# Patient Record
Sex: Female | Born: 1955 | Race: Black or African American | Hispanic: No | Marital: Single | Smoking: Current every day smoker
Health system: Southern US, Community
[De-identification: ages and names within clinical notes are randomized; demographics above are authoritative.]

---

## 2005-10-06 ENCOUNTER — Emergency Department (HOSPITAL_COMMUNITY): Admission: EM | Admit: 2005-10-06 | Discharge: 2005-10-06 | Payer: Self-pay | Admitting: *Deleted

## 2007-11-14 ENCOUNTER — Emergency Department (HOSPITAL_COMMUNITY): Admission: EM | Admit: 2007-11-14 | Discharge: 2007-11-14 | Payer: Self-pay | Admitting: Emergency Medicine

## 2007-11-17 ENCOUNTER — Emergency Department (HOSPITAL_COMMUNITY): Admission: EM | Admit: 2007-11-17 | Discharge: 2007-11-17 | Payer: Self-pay | Admitting: Emergency Medicine

## 2010-10-14 LAB — POCT I-STAT, CHEM 8
BUN: 15
Chloride: 106
Creatinine, Ser: 1.2
Potassium: 4.4
Sodium: 140

## 2018-10-31 ENCOUNTER — Other Ambulatory Visit: Payer: Self-pay

## 2018-10-31 ENCOUNTER — Encounter (HOSPITAL_COMMUNITY): Payer: Self-pay

## 2018-10-31 ENCOUNTER — Ambulatory Visit (HOSPITAL_COMMUNITY)
Admission: EM | Admit: 2018-10-31 | Discharge: 2018-10-31 | Disposition: A | Discharge status: Home / Self Care | Payer: Self-pay | Attending: Family Medicine | Admitting: Family Medicine

## 2018-10-31 DIAGNOSIS — M5431 Sciatica, right side: Secondary | ICD-10-CM

## 2018-10-31 MED ORDER — METHYLPREDNISOLONE 4 MG PO TBPK: ORAL_TABLET | ORAL | 0 refills | Status: DC

## 2018-10-31 MED ORDER — HYDROCODONE-ACETAMINOPHEN 5-325 MG PO TABS: 1.0000 | ORAL_TABLET | Freq: Four times a day (QID) | ORAL | 0 refills | Status: DC | PRN

## 2018-10-31 MED ORDER — NAPROXEN 500 MG PO TABS: 500.0000 mg | ORAL_TABLET | Freq: Two times a day (BID) | ORAL | 0 refills | Status: DC

## 2018-10-31 MED ORDER — TIZANIDINE HCL 4 MG PO TABS: 4.0000 mg | ORAL_TABLET | Freq: Four times a day (QID) | ORAL | 0 refills | Status: DC | PRN

## 2018-10-31 NOTE — ED Provider Notes (Signed)
[place]    CSN: [id number] Arrival date & time: 10/31/18  [id number]      History   Chief Complaint Chief Complaint  Patient presents with  . Back Pain    HPI Tracy Escobar is a 63 y.o. female.   HPI  Patient states she is healthy.  She is on no medications.  She is retired.  She lives with a nephew.  She is here for low back pain.'s been going on for about 2 months.  At first it was intermittent and mild.  Now is becoming more severe.  The pain is in the right low back.  It goes from her buttock all the way down to the leg.  Is worse with activity.  Is better with rest.  The worst activity is prolonged standing.  No falls or injury.  No trauma.  No motor vehicle accident.  No prior back problems. Explained to patient that her blood pressure is elevated.  She states it is often elevated when she goes to the doctor.  She is in pain.  She is not sleeping well. No bowel or bladder complaint.  History reviewed. No pertinent past medical history.  There are no active problems to display for this patient.   History reviewed. No pertinent surgical history.  OB History   No obstetric history on file.      Home Medications    Prior to Admission medications   Medication Sig Start Date End Date Taking? Authorizing Provider  HYDROcodone-acetaminophen (NORCO/VICODIN) 5-325 MG tablet Take 1-2 tablets by mouth every 6 (six) hours as needed. 10/31/18   Raylene Everts, MD  methylPREDNISolone (MEDROL DOSEPAK) 4 MG TBPK tablet TAD 10/31/18   Raylene Everts, MD  naproxen (NAPROSYN) 500 MG tablet Take 1 tablet (500 mg total) by mouth 2 (two) times daily. 10/31/18   Raylene Everts, MD  tiZANidine (ZANAFLEX) 4 MG tablet Take 1-2 tablets (4-8 mg total) by mouth every 6 (six) hours as needed for muscle spasms. 10/31/18   Raylene Everts, MD    Family History Family History  Problem Relation Age of Onset  . Cancer Mother   . Cancer Father     Social History  Social History   Tobacco Use  . Smoking status: Current Every Day Smoker    Packs/day: 0.50    Types: Cigarettes  . Smokeless tobacco: Never Used  Substance Use Topics  . Alcohol use: Yes    Comment: socially  . Drug use: Not on file     Allergies   Patient has no known allergies.   Review of Systems Review of Systems  Constitutional: Negative for chills and fever.  HENT: Negative for ear pain and sore throat.   Eyes: Negative for pain and visual disturbance.  Respiratory: Negative for cough and shortness of breath.   Cardiovascular: Negative for chest pain and palpitations.  Gastrointestinal: Negative for abdominal pain and vomiting.  Genitourinary: Negative for dysuria and hematuria.  Musculoskeletal: Positive for back pain. Negative for arthralgias.  Skin: Negative for color change and rash.  Neurological: Negative for seizures and syncope.  All other systems reviewed and are negative.    Physical Exam Triage Vital Signs ED Triage Vitals  Enc Vitals Group     BP 10/31/18 1047 (!) 182/89     Pulse Rate 10/31/18 1047 76     Resp 10/31/18 1047 16     Temp 10/31/18 1047 98.3 F (36.8 C)     Temp Source  10/31/18 1047 Oral     SpO2 10/31/18 1047 100 %     Weight --      Height --      Head Circumference --      Peak Flow --      Pain Score 10/31/18 1044 7     Pain Loc --      Pain Edu? --      Excl. in [place]? --    No data found.  Updated Vital Signs BP (!) 182/89 (BP Location: Right Arm)   Pulse 76   Temp 98.3 F (36.8 C) (Oral)   Resp 16   LMP 10/30/2000 (Approximate)   SpO2 100%  :     Physical Exam Constitutional:      General: She is not in acute distress.    Appearance: She is well-developed.     Comments: Moves slowly.  Guarded movements.  Appears uncomfortable  HENT:     Head: Normocephalic and atraumatic.     Mouth/Throat:     Mouth: Mucous membranes are moist.     Pharynx: No posterior oropharyngeal erythema.  Eyes:      Conjunctiva/sclera: Conjunctivae normal.     Pupils: Pupils are equal, round, and reactive to light.  Neck:     Musculoskeletal: Normal range of motion and neck supple. No muscular tenderness.  Cardiovascular:     Rate and Rhythm: Normal rate and regular rhythm.     Heart sounds: Normal heart sounds.  Pulmonary:     Effort: Pulmonary effort is normal. No respiratory distress.     Breath sounds: Normal breath sounds.  Abdominal:     General: There is no distension.     Palpations: Abdomen is soft.  Musculoskeletal: Normal range of motion.     Comments: Lumbar spine is straight and symmetric. Full range of motion. No tenderness or muscle spasm. Strength, sensation, range of motion, are normal in both lower extremities. Straight leg raise is negative bilateral.At  Full right leg extension patient complains of buttock pain   Skin:    General: Skin is warm and dry.  Neurological:     General: No focal deficit present.     Mental Status: She is alert.     Deep Tendon Reflexes: Reflexes abnormal.     Comments: Right knee reflex is diminished  Psychiatric:        Mood and Affect: Mood normal.      UC Treatments / Results  Labs (all labs ordered are listed, but only abnormal results are displayed) Labs Reviewed - No data to display  EKG   Radiology No results found.  Procedures Procedures (including critical care time)  Medications Ordered in UC Medications - No data to display  Initial Impression / Assessment and Plan / UC Course  I have reviewed the triage vital signs and the nursing notes.  Pertinent labs & imaging results that were available during my care of the patient were reviewed by me and considered in my medical decision making (see chart for details).     Reviewed x-rays are not indicated for nontraumatic pain.  This has been going on for a couple of months without improvement in spite of modifying activities and taking a variety of medicines including Advil  and Aleve.  We will treat medically.  Patient understands that if she fails to improve she may need additional care Final Clinical Impressions(s) / UC Diagnoses   Final diagnoses:  Sciatica of right side  Discharge Instructions     Take the medrol dosepack This is the steroid to reduce inflammation around the nerve Take the naprosyn AFTER dosepack  Take the tizanadine ( muscle relaxer) and the hydrocodone ( pain as needed) Activity as tolerated, rest frequently during day Return or see PCP if not improving in a week or two    ED Prescriptions    Medication Sig Dispense Auth. Provider   methylPREDNISolone (MEDROL DOSEPAK) 4 MG TBPK tablet TAD 21 tablet Raylene Everts, MD   naproxen (NAPROSYN) 500 MG tablet Take 1 tablet (500 mg total) by mouth 2 (two) times daily. 30 tablet Raylene Everts, MD   tiZANidine (ZANAFLEX) 4 MG tablet Take 1-2 tablets (4-8 mg total) by mouth every 6 (six) hours as needed for muscle spasms. 21 tablet Raylene Everts, MD   HYDROcodone-acetaminophen (NORCO/VICODIN) 5-325 MG tablet Take 1-2 tablets by mouth every 6 (six) hours as needed. 10 tablet Raylene Everts, MD     I have reviewed the PDMP during this encounter.   Raylene Everts, MD 10/31/18 1122

## 2018-10-31 NOTE — Discharge Instructions (Signed)
Take the medrol dosepack This is the steroid to reduce inflammation around the nerve Take the naprosyn AFTER dosepack  Take the tizanadine ( muscle relaxer) and the hydrocodone ( pain as needed) Activity as tolerated, rest frequently during day Return or see PCP if not improving in a week or two

## 2018-10-31 NOTE — ED Triage Notes (Signed)
Patient presents to Urgent Care with complaints of back pain (all along spine) since 2 months ago. Patient reports sometimes the pain radiates from her back all the way down to her feet.

## 2019-01-15 ENCOUNTER — Other Ambulatory Visit: Payer: Self-pay

## 2019-01-15 ENCOUNTER — Emergency Department (HOSPITAL_COMMUNITY)
Admission: EM | Admit: 2019-01-15 | Discharge: 2019-01-15 | Disposition: A | Discharge status: Home / Self Care | Payer: Self-pay | Attending: Emergency Medicine | Admitting: Emergency Medicine

## 2019-01-15 DIAGNOSIS — Z79899 Other long term (current) drug therapy: Secondary | ICD-10-CM | POA: Insufficient documentation

## 2019-01-15 DIAGNOSIS — F17210 Nicotine dependence, cigarettes, uncomplicated: Secondary | ICD-10-CM | POA: Insufficient documentation

## 2019-01-15 DIAGNOSIS — M5441 Lumbago with sciatica, right side: Secondary | ICD-10-CM | POA: Insufficient documentation

## 2019-01-15 MED ORDER — KETOROLAC TROMETHAMINE 30 MG/ML IJ SOLN: 30.0000 mg | Freq: Once | INTRAMUSCULAR | Status: DC

## 2019-01-15 MED ORDER — KETOROLAC TROMETHAMINE 30 MG/ML IJ SOLN
30.0000 mg | Freq: Once | INTRAMUSCULAR | Status: AC
  Administered 2019-01-15: 30 mg via INTRAMUSCULAR
  Filled 2019-01-15: qty 1

## 2019-01-15 MED ORDER — PREDNISONE 20 MG PO TABS: 40.0000 mg | ORAL_TABLET | Freq: Every day | ORAL | 0 refills | Status: AC

## 2019-01-15 NOTE — Discharge Instructions (Addendum)
You have been seen today for back pain. Please read and follow all provided instructions. Return to the emergency room for worsening condition or new concerning symptoms.  1. Medications:  Prescription to your pharmacy for prednisone.  This is a steroid used to help treat inflammation.  Please take as prescribed.  Continue usual home medications Take medications as prescribed. Please review all of the medicines and only take them if you do not have an allergy to them.   2. Treatment: rest, drink plenty of fluids  3. Follow Up:  Please follow up with primary care provider by scheduling an appointment as soon as possible for a visit  If you do not have a primary care physician, contact [facility] at [phone] for referral   It is also a possibility that you have an allergic reaction to any of the medicines that you have been prescribed - Everybody reacts differently to medications and while MOST people have no trouble with most medicines, you may have a reaction such as nausea, vomiting, rash, swelling, shortness of breath. If this is the case, please stop taking the medicine immediately and contact your physician.   [facility] Dermatologists:  [facility]  [place] [address]  [phone]   Dr. Michelene Gardener, MD  [place]  [phone]  [facility]  [place]  [phone]   [place]  [phone]  Lavonna Monarch MD  [place]  [phone]  Katrina Stack  [place]  [phone]  [place] Amy Y MD  [address]  [phone]  [place]  [address]  [phone]

## 2019-01-15 NOTE — ED Provider Notes (Signed)
[place] EMERGENCY DEPARTMENT Provider Note   CSN: [id number] Arrival date & time: 01/15/19  1140     History Chief Complaint  Patient presents with  . Leg Pain  . Back Pain    Tracy Escobar is a 64 y.o. female presenting to emergency department today with chief complaint of back pain x 4 days. Patient states pain is in her right lower back and radiates down her leg. She describes pain as sharp and shooting. Pain is worse with movement and better at rest. She had tried taking tylenol without symptom relief. She has history of similar pain in 10/2018 treated with steroids with complete resolution of symptoms, thought to be sciatica.   Denies fevers, weight loss, numbness/weakness of upper and lower extremities, bowel/bladder incontinence, urinary retention, history of cancer, saddle anesthesia, history of back surgery, history of IVDA. History provided by patient with additional history obtained from chart review.        No past medical history on file.  There are no problems to display for this patient.   No past surgical history on file.   OB History   No obstetric history on file.     Family History  Problem Relation Age of Onset  . Cancer Mother   . Cancer Father     Social History   Tobacco Use  . Smoking status: Current Every Day Smoker    Packs/day: 0.50    Types: Cigarettes  . Smokeless tobacco: Never Used  Substance Use Topics  . Alcohol use: Yes    Comment: socially  . Drug use: Not on file    Home Medications Prior to Admission medications   Medication Sig Start Date End Date Taking? Authorizing Provider  HYDROcodone-acetaminophen (NORCO/VICODIN) 5-325 MG tablet Take 1-2 tablets by mouth every 6 (six) hours as needed. 10/31/18   Raylene Everts, MD  naproxen (NAPROSYN) 500 MG tablet Take 1 tablet (500 mg total) by mouth 2 (two) times daily. 10/31/18   Raylene Everts, MD  predniSONE (DELTASONE) 20 MG tablet Take 2 tablets  (40 mg total) by mouth daily for 5 days. 01/15/19 01/20/19  [employer] Null E, PA-C  tiZANidine (ZANAFLEX) 4 MG tablet Take 1-2 tablets (4-8 mg total) by mouth every 6 (six) hours as needed for muscle spasms. 10/31/18   Raylene Everts, MD    Allergies    Patient has no known allergies.  Review of Systems   Review of Systems  All other systems are reviewed and are negative for acute change except as noted in the HPI.   Physical Exam Updated Vital Signs BP 124/80   Pulse 86   Temp 97.7 F (36.5 C) (Oral)   Resp 16   LMP 10/30/2000 (Approximate)   SpO2 95%   Physical Exam Vitals and nursing note reviewed.  Constitutional:      General: She is not in acute distress.    Appearance: She is not ill-appearing.  HENT:     Head: Normocephalic and atraumatic.     Right Ear: External ear normal.     Left Ear: External ear normal.     Nose: Nose normal.     Mouth/Throat:     Mouth: Mucous membranes are moist.     Pharynx: Oropharynx is clear.  Eyes:     General: No scleral icterus.       Right eye: No discharge.        Left eye: No discharge.     Conjunctiva/sclera: Conjunctivae  normal.  Neck:     Vascular: No JVD.  Cardiovascular:     Rate and Rhythm: Normal rate and regular rhythm.     Pulses: Normal pulses.          Radial pulses are 2+ on the right side and 2+ on the left side.     Heart sounds: Normal heart sounds.  Pulmonary:     Comments: Lungs clear to auscultation in all fields. Symmetric chest rise. No wheezing, rales, or rhonchi. Abdominal:     Comments: Abdomen is soft, non-distended, and non-tender in all quadrants. No rigidity, no guarding. No peritoneal signs.  Musculoskeletal:        General: Normal range of motion.     Cervical back: Normal range of motion.     Comments: Full range of motion of thoracic and lumbar spine. Tenderness to palpation of paraspinal muscles of lumbar spine and right buttock. No  muscle spasm. Strength, sensation, range of motion,  are normal in both lower extremities. No overlying skin changes. Straight leg raise is negative bilaterally.   Skin:    General: Skin is warm and dry.     Capillary Refill: Capillary refill takes less than 2 seconds.  Neurological:     Mental Status: She is oriented to person, place, and time.     GCS: GCS eye subscore is 4. GCS verbal subscore is 5. GCS motor subscore is 6.     Comments: Sensation grossly intact to light touch in the lower extremities bilaterally. No saddle anesthesias. Strength 5/5 with flexion and extension at the bilateral hips, knees, and ankles. No noted gait deficit. Coordination intact with heel to shin testing.  Psychiatric:        Behavior: Behavior normal.       ED Results / Procedures / Treatments   Labs (all labs ordered are listed, but only abnormal results are displayed) Labs Reviewed - No data to display  EKG None  Radiology No results found.  Procedures Procedures (including critical care time)  Medications Ordered in ED Medications  ketorolac (TORADOL) 30 MG/ML injection 30 mg (30 mg Intramuscular Given 01/15/19 1522)    ED Course  I have reviewed the triage vital signs and the nursing notes.  Pertinent labs & imaging results that were available during my care of the patient were reviewed by me and considered in my medical decision making (see chart for details).    MDM Rules/Calculators/A&P                      Normal neurological exam, no evidence of urinary incontinence or retention, pain is consistently reproducible. There is no evidence of AAA or concern for dissection at this time.   Patient can walk but states is painful.  No loss of bowel or bladder control.  No concern for cauda equina.  No fever, night sweats, weight loss, h/o cancer, IVDU.  Pain treated here in the department with adequate improvement. Will discharge with short course of steroids and recommend tylenol for pain. I have also discussed reasons to return immediately  to the ER.  Patient expresses understanding and agrees with plan. Recommend she follow up with pcp if symptoms persist.  The patient appears reasonably screened and/or stabilized for discharge and I doubt any other medical condition or other [facility] requiring further screening, evaluation, or treatment in the ED at this time prior to discharge.   Portions of this note were generated with L[employer]. Dictation errors  may occur despite best attempts at proofreading.    Final Clinical Impression(s) / ED Diagnoses Final diagnoses:  Acute low back pain with right-sided sciatica, unspecified back pain laterality    Rx / DC Orders ED Discharge Orders         Ordered    predniSONE (DELTASONE) 20 MG tablet  Daily     01/15/19 1503           Flint Melter 01/15/19 2140    Virgel Manifold, MD 01/16/19 [phone]

## 2019-01-15 NOTE — ED Triage Notes (Signed)
Patient complains of back pain with radiation to leg for several days, hx of sciatica. Denies trauma

## 2019-01-20 ENCOUNTER — Other Ambulatory Visit: Payer: Self-pay

## 2019-01-20 ENCOUNTER — Inpatient Hospital Stay (HOSPITAL_COMMUNITY)
Admission: EM | Admit: 2019-01-20 | Discharge: 2019-02-05 | DRG: 177 | Disposition: A | Discharge status: Home Health | Payer: HRSA Program | Attending: Internal Medicine | Admitting: Internal Medicine

## 2019-01-20 ENCOUNTER — Emergency Department (HOSPITAL_COMMUNITY): Payer: HRSA Program

## 2019-01-20 DIAGNOSIS — N19 Unspecified kidney failure: Secondary | ICD-10-CM | POA: Diagnosis not present

## 2019-01-20 DIAGNOSIS — E1165 Type 2 diabetes mellitus with hyperglycemia: Secondary | ICD-10-CM | POA: Diagnosis present

## 2019-01-20 DIAGNOSIS — Z7289 Other problems related to lifestyle: Secondary | ICD-10-CM

## 2019-01-20 DIAGNOSIS — G9341 Metabolic encephalopathy: Secondary | ICD-10-CM | POA: Diagnosis present

## 2019-01-20 DIAGNOSIS — N184 Chronic kidney disease, stage 4 (severe): Secondary | ICD-10-CM | POA: Diagnosis present

## 2019-01-20 DIAGNOSIS — E1122 Type 2 diabetes mellitus with diabetic chronic kidney disease: Secondary | ICD-10-CM | POA: Diagnosis present

## 2019-01-20 DIAGNOSIS — F17210 Nicotine dependence, cigarettes, uncomplicated: Secondary | ICD-10-CM | POA: Diagnosis present

## 2019-01-20 DIAGNOSIS — R252 Cramp and spasm: Secondary | ICD-10-CM | POA: Diagnosis not present

## 2019-01-20 DIAGNOSIS — J1282 Pneumonia due to coronavirus disease 2019: Secondary | ICD-10-CM | POA: Diagnosis present

## 2019-01-20 DIAGNOSIS — N179 Acute kidney failure, unspecified: Secondary | ICD-10-CM

## 2019-01-20 DIAGNOSIS — E669 Obesity, unspecified: Secondary | ICD-10-CM | POA: Diagnosis present

## 2019-01-20 DIAGNOSIS — R4182 Altered mental status, unspecified: Secondary | ICD-10-CM

## 2019-01-20 DIAGNOSIS — N390 Urinary tract infection, site not specified: Secondary | ICD-10-CM | POA: Diagnosis present

## 2019-01-20 DIAGNOSIS — R451 Restlessness and agitation: Secondary | ICD-10-CM | POA: Diagnosis not present

## 2019-01-20 DIAGNOSIS — Z713 Dietary counseling and surveillance: Secondary | ICD-10-CM

## 2019-01-20 DIAGNOSIS — Z6830 Body mass index (BMI) 30.0-30.9, adult: Secondary | ICD-10-CM

## 2019-01-20 DIAGNOSIS — E875 Hyperkalemia: Secondary | ICD-10-CM | POA: Diagnosis present

## 2019-01-20 DIAGNOSIS — U071 COVID-19: Principal | ICD-10-CM | POA: Diagnosis present

## 2019-01-20 DIAGNOSIS — E876 Hypokalemia: Secondary | ICD-10-CM | POA: Diagnosis not present

## 2019-01-20 DIAGNOSIS — Z23 Encounter for immunization: Secondary | ICD-10-CM

## 2019-01-20 DIAGNOSIS — D638 Anemia in other chronic diseases classified elsewhere: Secondary | ICD-10-CM | POA: Diagnosis present

## 2019-01-20 DIAGNOSIS — R0602 Shortness of breath: Secondary | ICD-10-CM

## 2019-01-20 DIAGNOSIS — Z6831 Body mass index (BMI) 31.0-31.9, adult: Secondary | ICD-10-CM

## 2019-01-20 DIAGNOSIS — Z79899 Other long term (current) drug therapy: Secondary | ICD-10-CM

## 2019-01-20 DIAGNOSIS — F419 Anxiety disorder, unspecified: Secondary | ICD-10-CM | POA: Diagnosis present

## 2019-01-20 DIAGNOSIS — I129 Hypertensive chronic kidney disease with stage 1 through stage 4 chronic kidney disease, or unspecified chronic kidney disease: Secondary | ICD-10-CM | POA: Diagnosis present

## 2019-01-20 DIAGNOSIS — D72829 Elevated white blood cell count, unspecified: Secondary | ICD-10-CM | POA: Diagnosis present

## 2019-01-20 DIAGNOSIS — E872 Acidosis: Secondary | ICD-10-CM | POA: Diagnosis present

## 2019-01-20 LAB — BASIC METABOLIC PANEL
Anion gap: 15 (ref 5–15)
BUN: 148 mg/dL — ABNORMAL HIGH (ref 8–23)
CO2: 10 mmol/L — ABNORMAL LOW (ref 22–32)
Calcium: 8.8 mg/dL — ABNORMAL LOW (ref 8.9–10.3)
Chloride: 112 mmol/L — ABNORMAL HIGH (ref 98–111)
Creatinine, Ser: 8.64 mg/dL — ABNORMAL HIGH (ref 0.44–1.00)
GFR calc Af Amer: 5 mL/min — ABNORMAL LOW (ref >60)
GFR calc non Af Amer: 4 mL/min — ABNORMAL LOW (ref >60)
Glucose, Bld: 145 mg/dL — ABNORMAL HIGH (ref 70–99)
Potassium: 6 mmol/L — ABNORMAL HIGH (ref 3.5–5.1)
Sodium: 137 mmol/L (ref 135–145)

## 2019-01-20 LAB — CBC
HCT: 32 % — ABNORMAL LOW (ref 36.0–46.0)
Hemoglobin: 10.7 g/dL — ABNORMAL LOW (ref 12.0–15.0)
MCH: 28.4 pg (ref 26.0–34.0)
MCHC: 33.4 g/dL (ref 30.0–36.0)
MCV: 84.9 fL (ref 80.0–100.0)
Platelets: 367 10*3/uL (ref 150–400)
RBC: 3.77 MIL/uL — ABNORMAL LOW (ref 3.87–5.11)
RDW: 16.7 % — ABNORMAL HIGH (ref 11.5–15.5)
WBC: 15.8 10*3/uL — ABNORMAL HIGH (ref 4.0–10.5)
nRBC: 0 % (ref 0.0–0.2)

## 2019-01-20 LAB — ACETAMINOPHEN LEVEL: Acetaminophen (Tylenol), Serum: <10 ug/mL — ABNORMAL LOW (ref 10–30)

## 2019-01-20 LAB — SALICYLATE LEVEL: Salicylate Lvl: <7 mg/dL — ABNORMAL LOW (ref 7.0–30.0)

## 2019-01-20 LAB — ETHANOL: Alcohol, Ethyl (B): <10 mg/dL (ref <10)

## 2019-01-20 LAB — CBG MONITORING, ED: Glucose-Capillary: 117 mg/dL — ABNORMAL HIGH (ref 70–99)

## 2019-01-20 MED ORDER — SODIUM CHLORIDE 0.9% FLUSH
3.0000 mL | Freq: Once | INTRAVENOUS | Status: AC
  Administered 2019-01-21: 01:00:00 3 mL via INTRAVENOUS

## 2019-01-20 NOTE — ED Provider Notes (Addendum)
[facility] Emergency Department Provider Note MRN:  [id number]  Arrival date & time: 01/21/19     Chief Complaint   Altered Mental Status   History of Present Illness   Tracy Escobar is a 64 y.o. year-old female with unknown past medical history presenting to the ED with chief complaint of altered mental status.  Patient not acting normally, brought here by family given concern that she may have taken too much gabapentin.  Patient is very confused.  I was unable to obtain an accurate HPI, PMH, or ROS due to the patient's altered mental status.  Level 5 caveat.  Review of Systems  Positive for altered mental status.  Patient's Health History   No past medical history on file.  No past surgical history on file.  Family History  Problem Relation Age of Onset  . Cancer Mother   . Cancer Father     Social History   Socioeconomic History  . Marital status: Single    Spouse name: Not on file  . Number of children: Not on file  . Years of education: Not on file  . Highest education level: Not on file  Occupational History  . Not on file  Tobacco Use  . Smoking status: Current Every Day Smoker    Packs/day: 0.50    Types: Cigarettes  . Smokeless tobacco: Never Used  Substance and Sexual Activity  . Alcohol use: Yes    Comment: socially  . Drug use: Not on file  . Sexual activity: Not on file  Other Topics Concern  . Not on file  Social History Narrative  . Not on file   Social Determinants of Health   Financial Resource Strain:   . Difficulty of Paying Living Expenses: Not on file  Food Insecurity:   . Worried About Charity fundraiser in the Last Year: Not on file  . Ran Out of Food in the Last Year: Not on file  Transportation Needs:   . Lack of Transportation (Medical): Not on file  . Lack of Transportation (Non-Medical): Not on file  Physical Activity:   . Days of Exercise per Week: Not on file  . Minutes of Exercise per Session:  Not on file  Stress:   . Feeling of Stress : Not on file  Social Connections:   . Frequency of Communication with Friends and Family: Not on file  . Frequency of Social Gatherings with Friends and Family: Not on file  . Attends Religious Services: Not on file  . Active Member of Clubs or Organizations: Not on file  . Attends Archivist Meetings: Not on file  . Marital Status: Not on file  Intimate Partner Violence:   . Fear of Current or Ex-Partner: Not on file  . Emotionally Abused: Not on file  . Physically Abused: Not on file  . Sexually Abused: Not on file     Physical Exam  Vital Signs and Nursing Notes reviewed Vitals:   01/20/19 2220  BP: (!) 128/110  Pulse: (!) 103  Resp: 18  Temp: 98 F (36.7 C)  SpO2: 92%    CONSTITUTIONAL: Well-appearing, NAD NEURO: Somnolent, opens eyes to voice, follows commands but is easily confused, normal and symmetric strength and sensation, clumsy gait EYES:  eyes equal and reactive ENT/NECK:  no LAD, no JVD CARDIO: Regular rate, well-perfused, normal S1 and S2 PULM:  CTAB no wheezing or rhonchi GI/GU:  normal bowel sounds, non-distended, non-tender MSK/SPINE:  No gross deformities,  no edema SKIN:  no rash, atraumatic PSYCH:  Appropriate speech and behavior  Diagnostic and Interventional Summary    EKG Interpretation  Date/Time:  Friday January 20 2019 23:41:57 EST Ventricular Rate:  95 PR Interval:    QRS Duration: 87 QT Interval:  326 QTC Calculation: 410 R Axis:   14 Text Interpretation: Sinus rhythm Consider right atrial enlargement Left ventricular hypertrophy No previous ECGs available Confirmed by Gerlene Fee [id number]) on 01/20/2019 11:44:56 PM      Labs Reviewed  BASIC METABOLIC PANEL - Abnormal; Notable for the following components:      Result Value   Potassium 6.0 (*)    Chloride 112 (*)    CO2 10 (*)    Glucose, Bld 145 (*)    BUN 148 (*)    Creatinine, Ser 8.64 (*)    Calcium 8.8 (*)    GFR calc  non Af Amer 4 (*)    GFR calc Af Amer 5 (*)    All other components within normal limits  CBC - Abnormal; Notable for the following components:   WBC 15.8 (*)    RBC 3.77 (*)    Hemoglobin 10.7 (*)    HCT 32.0 (*)    RDW 16.7 (*)    All other components within normal limits  SALICYLATE LEVEL - Abnormal; Notable for the following components:   Salicylate Lvl [id number] (*)    All other components within normal limits  ACETAMINOPHEN LEVEL - Abnormal; Notable for the following components:   Acetaminophen (Tylenol), Serum <10 (*)    All other components within normal limits  CBG MONITORING, ED - Abnormal; Notable for the following components:   Glucose-Capillary 117 (*)    All other components within normal limits  ETHANOL  URINALYSIS, ROUTINE W REFLEX MICROSCOPIC  HEPATIC FUNCTION PANEL    CT Head Wo Contrast  Final Result      Medications  sodium chloride flush (NS) 0.9 % injection 3 mL (has no administration in time range)     Procedures  /  Critical Care .Critical Care Performed by: Maudie Flakes, MD Authorized by: Maudie Flakes, MD   Critical care provider statement:    Critical care time (minutes):  33   Critical care was necessary to treat or prevent imminent or life-threatening deterioration of the following conditions:  Renal failure   Critical care was time spent personally by me on the following activities:  Discussions with consultants, evaluation of patient's response to treatment, examination of patient, ordering and performing treatments and interventions, ordering and review of laboratory studies, ordering and review of radiographic studies, pulse oximetry, re-evaluation of patient's condition, obtaining history from patient or surrogate and review of old charts    ED Course and Medical Decision Making  I have reviewed the triage vital signs, the nursing notes, and pertinent available records from the EMR.  Pertinent labs & imaging results that were available  during my care of the patient were reviewed by me and considered in my medical decision making (see below for details).     Favoring pharmacologic etiology of patient's altered mental status, also considering metabolic etiology, UTI, much less likely to be CNS related given patient's nonfocal exam.  Will attempt to contact family for further information, will begin with labs, urinalysis, CT head.  CT head is without acute process.  Awaiting laboratory assessment.  Anticipating need for metabolization and reassessment.  I spoke with patient's daughter, who knows very little information, advised me to  call patient's nephew.  I attempted to call but there was no answer and no voicemail.  Signed out to oncoming provider at shift change.  12 AM update: Labs revealing acute renal failure, will need nephrology consultation and admission.  Barth Kirks. Sedonia Small, MD [place] [email or web address]  Final Clinical Impressions(s) / ED Diagnoses     ICD-10-CM   1. Altered mental status, unspecified altered mental status type  R41.82   2. Acute renal failure, unspecified acute renal failure type [facility])  N17.9     ED Discharge Orders    None       Discharge Instructions Discussed with and Provided to Patient:   Discharge Instructions   None       Maudie Flakes, MD 01/20/19 2339    Maudie Flakes, MD 01/21/19 [phone]

## 2019-01-20 NOTE — ED Triage Notes (Signed)
Patient brought in by family with altered mental status, says she is not acting herself and concerned she may have taken too many gabapentin. The patient is restless, difficult to redirect at time, slurred speech. Says she took gabapentin, will not answer to how many.   Bary Richard (pt daughter [phone]) [address] [place] [id number]

## 2019-01-21 ENCOUNTER — Emergency Department (HOSPITAL_COMMUNITY): Payer: HRSA Program

## 2019-01-21 ENCOUNTER — Inpatient Hospital Stay (HOSPITAL_COMMUNITY): Payer: HRSA Program

## 2019-01-21 DIAGNOSIS — R4182 Altered mental status, unspecified: Secondary | ICD-10-CM | POA: Insufficient documentation

## 2019-01-21 DIAGNOSIS — J1282 Pneumonia due to coronavirus disease 2019: Secondary | ICD-10-CM | POA: Diagnosis present

## 2019-01-21 DIAGNOSIS — E875 Hyperkalemia: Secondary | ICD-10-CM | POA: Diagnosis present

## 2019-01-21 DIAGNOSIS — N390 Urinary tract infection, site not specified: Secondary | ICD-10-CM | POA: Diagnosis present

## 2019-01-21 DIAGNOSIS — D72829 Elevated white blood cell count, unspecified: Secondary | ICD-10-CM | POA: Diagnosis present

## 2019-01-21 DIAGNOSIS — E872 Acidosis: Secondary | ICD-10-CM | POA: Diagnosis present

## 2019-01-21 DIAGNOSIS — U071 COVID-19: Secondary | ICD-10-CM | POA: Diagnosis present

## 2019-01-21 DIAGNOSIS — N19 Unspecified kidney failure: Secondary | ICD-10-CM | POA: Diagnosis present

## 2019-01-21 DIAGNOSIS — D638 Anemia in other chronic diseases classified elsewhere: Secondary | ICD-10-CM | POA: Diagnosis present

## 2019-01-21 DIAGNOSIS — R252 Cramp and spasm: Secondary | ICD-10-CM | POA: Diagnosis not present

## 2019-01-21 DIAGNOSIS — Z23 Encounter for immunization: Secondary | ICD-10-CM | POA: Diagnosis not present

## 2019-01-21 DIAGNOSIS — F17210 Nicotine dependence, cigarettes, uncomplicated: Secondary | ICD-10-CM | POA: Diagnosis present

## 2019-01-21 DIAGNOSIS — Z79899 Other long term (current) drug therapy: Secondary | ICD-10-CM | POA: Diagnosis not present

## 2019-01-21 DIAGNOSIS — I129 Hypertensive chronic kidney disease with stage 1 through stage 4 chronic kidney disease, or unspecified chronic kidney disease: Secondary | ICD-10-CM | POA: Diagnosis present

## 2019-01-21 DIAGNOSIS — E876 Hypokalemia: Secondary | ICD-10-CM | POA: Diagnosis not present

## 2019-01-21 DIAGNOSIS — N179 Acute kidney failure, unspecified: Secondary | ICD-10-CM | POA: Diagnosis present

## 2019-01-21 DIAGNOSIS — R451 Restlessness and agitation: Secondary | ICD-10-CM | POA: Diagnosis not present

## 2019-01-21 DIAGNOSIS — E1122 Type 2 diabetes mellitus with diabetic chronic kidney disease: Secondary | ICD-10-CM | POA: Diagnosis present

## 2019-01-21 DIAGNOSIS — G9341 Metabolic encephalopathy: Secondary | ICD-10-CM | POA: Diagnosis present

## 2019-01-21 DIAGNOSIS — F419 Anxiety disorder, unspecified: Secondary | ICD-10-CM | POA: Diagnosis present

## 2019-01-21 DIAGNOSIS — E669 Obesity, unspecified: Secondary | ICD-10-CM | POA: Diagnosis present

## 2019-01-21 DIAGNOSIS — E1165 Type 2 diabetes mellitus with hyperglycemia: Secondary | ICD-10-CM | POA: Diagnosis present

## 2019-01-21 DIAGNOSIS — N184 Chronic kidney disease, stage 4 (severe): Secondary | ICD-10-CM | POA: Diagnosis present

## 2019-01-21 DIAGNOSIS — Z6831 Body mass index (BMI) 31.0-31.9, adult: Secondary | ICD-10-CM | POA: Diagnosis not present

## 2019-01-21 DIAGNOSIS — Z6830 Body mass index (BMI) 30.0-30.9, adult: Secondary | ICD-10-CM | POA: Diagnosis not present

## 2019-01-21 LAB — URINALYSIS, ROUTINE W REFLEX MICROSCOPIC
Bilirubin Urine: NEGATIVE
Glucose, UA: NEGATIVE mg/dL
Hgb urine dipstick: NEGATIVE
Ketones, ur: NEGATIVE mg/dL
Nitrite: NEGATIVE
Protein, ur: 100 mg/dL — AB
Specific Gravity, Urine: 1.034 — ABNORMAL HIGH (ref 1.005–1.030)
WBC, UA: >50 WBC/hpf — ABNORMAL HIGH (ref 0–5)
pH: 5 (ref 5.0–8.0)

## 2019-01-21 LAB — PROTEIN / CREATININE RATIO, URINE
Creatinine, Urine: 129.95 mg/dL
Total Protein, Urine: >3000 mg/dL

## 2019-01-21 LAB — CBC
HCT: 29.6 % — ABNORMAL LOW (ref 36.0–46.0)
HCT: 27.1 % — ABNORMAL LOW (ref 36.0–46.0)
Hemoglobin: 10 g/dL — ABNORMAL LOW (ref 12.0–15.0)
Hemoglobin: 9.2 g/dL — ABNORMAL LOW (ref 12.0–15.0)
MCH: 28.6 pg (ref 26.0–34.0)
MCH: 28.1 pg (ref 26.0–34.0)
MCHC: 33.8 g/dL (ref 30.0–36.0)
MCHC: 33.9 g/dL (ref 30.0–36.0)
MCV: 84.6 fL (ref 80.0–100.0)
MCV: 82.9 fL (ref 80.0–100.0)
Platelets: 600 10*3/uL — ABNORMAL HIGH (ref 150–400)
Platelets: 341 10*3/uL (ref 150–400)
RBC: 3.5 MIL/uL — ABNORMAL LOW (ref 3.87–5.11)
RBC: 3.27 MIL/uL — ABNORMAL LOW (ref 3.87–5.11)
RDW: 18 % — ABNORMAL HIGH (ref 11.5–15.5)
RDW: 16.4 % — ABNORMAL HIGH (ref 11.5–15.5)
WBC: 18 10*3/uL — ABNORMAL HIGH (ref 4.0–10.5)
WBC: 12.8 10*3/uL — ABNORMAL HIGH (ref 4.0–10.5)
nRBC: 0.1 % (ref 0.0–0.2)
nRBC: 0 % (ref 0.0–0.2)

## 2019-01-21 LAB — LACTIC ACID, PLASMA
Lactic Acid, Venous: 1.4 mmol/L (ref 0.5–1.9)
Lactic Acid, Venous: 1.1 mmol/L (ref 0.5–1.9)

## 2019-01-21 LAB — BASIC METABOLIC PANEL
Anion gap: 13 (ref 5–15)
Anion gap: 15 (ref 5–15)
Anion gap: 16 — ABNORMAL HIGH (ref 5–15)
BUN: 150 mg/dL — ABNORMAL HIGH (ref 8–23)
BUN: 147 mg/dL — ABNORMAL HIGH (ref 8–23)
BUN: 142 mg/dL — ABNORMAL HIGH (ref 8–23)
CO2: 13 mmol/L — ABNORMAL LOW (ref 22–32)
CO2: 14 mmol/L — ABNORMAL LOW (ref 22–32)
CO2: 17 mmol/L — ABNORMAL LOW (ref 22–32)
Calcium: 8.4 mg/dL — ABNORMAL LOW (ref 8.9–10.3)
Calcium: 8 mg/dL — ABNORMAL LOW (ref 8.9–10.3)
Calcium: 7.8 mg/dL — ABNORMAL LOW (ref 8.9–10.3)
Chloride: 114 mmol/L — ABNORMAL HIGH (ref 98–111)
Chloride: 113 mmol/L — ABNORMAL HIGH (ref 98–111)
Chloride: 109 mmol/L (ref 98–111)
Creatinine, Ser: 8.87 mg/dL — ABNORMAL HIGH (ref 0.44–1.00)
Creatinine, Ser: 8.86 mg/dL — ABNORMAL HIGH (ref 0.44–1.00)
Creatinine, Ser: 9.14 mg/dL — ABNORMAL HIGH (ref 0.44–1.00)
GFR calc Af Amer: 5 mL/min — ABNORMAL LOW (ref >60)
GFR calc Af Amer: 5 mL/min — ABNORMAL LOW (ref >60)
GFR calc Af Amer: 5 mL/min — ABNORMAL LOW (ref >60)
GFR calc non Af Amer: 4 mL/min — ABNORMAL LOW (ref >60)
GFR calc non Af Amer: 4 mL/min — ABNORMAL LOW (ref >60)
GFR calc non Af Amer: 4 mL/min — ABNORMAL LOW (ref >60)
Glucose, Bld: 142 mg/dL — ABNORMAL HIGH (ref 70–99)
Glucose, Bld: 126 mg/dL — ABNORMAL HIGH (ref 70–99)
Glucose, Bld: 189 mg/dL — ABNORMAL HIGH (ref 70–99)
Potassium: 6 mmol/L — ABNORMAL HIGH (ref 3.5–5.1)
Potassium: 4.9 mmol/L (ref 3.5–5.1)
Potassium: 4.1 mmol/L (ref 3.5–5.1)
Sodium: 140 mmol/L (ref 135–145)
Sodium: 142 mmol/L (ref 135–145)
Sodium: 142 mmol/L (ref 135–145)

## 2019-01-21 LAB — POCT I-STAT 7, (LYTES, BLD GAS, ICA,H+H)
Acid-base deficit: 11 mmol/L — ABNORMAL HIGH (ref 0.0–2.0)
Bicarbonate: 11.6 mmol/L — ABNORMAL LOW (ref 20.0–28.0)
Calcium, Ion: 1.18 mmol/L (ref 1.15–1.40)
HCT: 29 % — ABNORMAL LOW (ref 36.0–46.0)
Hemoglobin: 9.9 g/dL — ABNORMAL LOW (ref 12.0–15.0)
O2 Saturation: 94 %
Patient temperature: 98.1
Potassium: 5.6 mmol/L — ABNORMAL HIGH (ref 3.5–5.1)
Sodium: 139 mmol/L (ref 135–145)
TCO2: 12 mmol/L — ABNORMAL LOW (ref 22–32)
pCO2 arterial: 18.2 mmHg — CL (ref 32.0–48.0)
pH, Arterial: 7.411 (ref 7.350–7.450)
pO2, Arterial: 66 mmHg — ABNORMAL LOW (ref 83.0–108.0)

## 2019-01-21 LAB — HEPATIC FUNCTION PANEL
ALT: 10 U/L (ref 0–44)
AST: 16 U/L (ref 15–41)
Albumin: 1.6 g/dL — ABNORMAL LOW (ref 3.5–5.0)
Alkaline Phosphatase: 42 U/L (ref 38–126)
Bilirubin, Direct: <0.1 mg/dL (ref 0.0–0.2)
Total Bilirubin: 0.5 mg/dL (ref 0.3–1.2)
Total Protein: 6.4 g/dL — ABNORMAL LOW (ref 6.5–8.1)

## 2019-01-21 LAB — D-DIMER, QUANTITATIVE: D-Dimer, Quant: 6.5 ug/mL-FEU — ABNORMAL HIGH (ref 0.00–0.50)

## 2019-01-21 LAB — RESPIRATORY PANEL BY RT PCR (FLU A&B, COVID)
Influenza A by PCR: NEGATIVE
Influenza B by PCR: NEGATIVE
SARS Coronavirus 2 by RT PCR: POSITIVE — AB

## 2019-01-21 LAB — CBG MONITORING, ED: Glucose-Capillary: 137 mg/dL — ABNORMAL HIGH (ref 70–99)

## 2019-01-21 LAB — HIV ANTIBODY (ROUTINE TESTING W REFLEX): HIV Screen 4th Generation wRfx: NONREACTIVE

## 2019-01-21 LAB — CK: Total CK: 65 U/L (ref 38–234)

## 2019-01-21 LAB — C-REACTIVE PROTEIN: CRP: 26.4 mg/dL — ABNORMAL HIGH (ref <1.0)

## 2019-01-21 LAB — CREATININE, SERUM
Creatinine, Ser: 9.04 mg/dL — ABNORMAL HIGH (ref 0.44–1.00)
GFR calc Af Amer: 5 mL/min — ABNORMAL LOW (ref >60)
GFR calc non Af Amer: 4 mL/min — ABNORMAL LOW (ref >60)

## 2019-01-21 LAB — FERRITIN: Ferritin: 832 ng/mL — ABNORMAL HIGH (ref 11–307)

## 2019-01-21 MED ORDER — DEXAMETHASONE SODIUM PHOSPHATE 10 MG/ML IJ SOLN
6.0000 mg | INTRAMUSCULAR | Status: DC
  Administered 2019-01-21 – 2019-01-25 (×5): 6 mg via INTRAVENOUS
  Filled 2019-01-21 (×5): qty 1

## 2019-01-21 MED ORDER — SODIUM CHLORIDE 0.9 % IV SOLN
500.0000 mg | INTRAVENOUS | Status: AC
  Administered 2019-01-21 – 2019-01-25 (×5): 500 mg via INTRAVENOUS
  Filled 2019-01-21 (×5): qty 500

## 2019-01-21 MED ORDER — AMLODIPINE BESYLATE 5 MG PO TABS
2.5000 mg | ORAL_TABLET | Freq: Every day | ORAL | Status: DC
  Administered 2019-01-21 – 2019-01-22 (×2): 2.5 mg via ORAL
  Filled 2019-01-21 (×2): qty 1

## 2019-01-21 MED ORDER — SODIUM BICARBONATE-DEXTROSE 150-5 MEQ/L-% IV SOLN
150.0000 meq | INTRAVENOUS | Status: DC
  Filled 2019-01-21: qty 1000

## 2019-01-21 MED ORDER — SODIUM BICARBONATE 8.4 % IV SOLN
50.0000 meq | Freq: Once | INTRAVENOUS | Status: AC
  Administered 2019-01-21: 06:00:00 50 meq via INTRAVENOUS
  Filled 2019-01-21: qty 50

## 2019-01-21 MED ORDER — LACTATED RINGERS IV SOLN: INTRAVENOUS | Status: DC

## 2019-01-21 MED ORDER — SODIUM CHLORIDE 0.9 % IV SOLN
200.0000 mg | Freq: Once | INTRAVENOUS | Status: AC
  Administered 2019-01-21: 200 mg via INTRAVENOUS
  Filled 2019-01-21: qty 200

## 2019-01-21 MED ORDER — SODIUM BICARBONATE 8.4 % IV SOLN
50.0000 meq | Freq: Once | INTRAVENOUS | Status: AC
  Administered 2019-01-21: 01:00:00 50 meq via INTRAVENOUS
  Filled 2019-01-21: qty 50

## 2019-01-21 MED ORDER — CHLORHEXIDINE GLUCONATE CLOTH 2 % EX PADS
6.0000 | MEDICATED_PAD | Freq: Every day | CUTANEOUS | Status: DC
  Administered 2019-01-23 – 2019-01-30 (×6): 6 via TOPICAL

## 2019-01-21 MED ORDER — LACTATED RINGERS IV BOLUS
1000.0000 mL | Freq: Once | INTRAVENOUS | Status: AC
  Administered 2019-01-21: 02:00:00 1000 mL via INTRAVENOUS

## 2019-01-21 MED ORDER — MIDAZOLAM HCL 2 MG/2ML IJ SOLN
2.0000 mg | Freq: Once | INTRAMUSCULAR | Status: AC
  Administered 2019-01-21: 18:00:00 1 mg via INTRAVENOUS
  Filled 2019-01-21: qty 2

## 2019-01-21 MED ORDER — SODIUM CHLORIDE 0.9 % IV SOLN: INTRAVENOUS | Status: DC

## 2019-01-21 MED ORDER — HEPARIN SODIUM (PORCINE) 5000 UNIT/ML IJ SOLN: 5000.0000 [IU] | Freq: Three times a day (TID) | INTRAMUSCULAR | Status: DC

## 2019-01-21 MED ORDER — GUAIFENESIN-DM 100-10 MG/5ML PO SYRP
10.0000 mL | ORAL_SOLUTION | ORAL | Status: DC | PRN
  Administered 2019-01-28: 22:00:00 10 mL via ORAL
  Filled 2019-01-21: qty 10

## 2019-01-21 MED ORDER — FUROSEMIDE 10 MG/ML IJ SOLN: 40.0000 mg | Freq: Once | INTRAMUSCULAR | Status: DC

## 2019-01-21 MED ORDER — HEPARIN SODIUM (PORCINE) 5000 UNIT/ML IJ SOLN
5000.0000 [IU] | Freq: Three times a day (TID) | INTRAMUSCULAR | Status: DC
  Administered 2019-01-21 – 2019-01-23 (×6): 5000 [IU] via SUBCUTANEOUS
  Filled 2019-01-21 (×7): qty 1

## 2019-01-21 MED ORDER — DEXTROSE 50 % IV SOLN: 1.0000 | Freq: Once | INTRAVENOUS | Status: DC

## 2019-01-21 MED ORDER — SODIUM POLYSTYRENE SULFONATE 15 GM/60ML PO SUSP
30.0000 g | Freq: Once | ORAL | Status: AC
  Administered 2019-01-21: 30 g via ORAL
  Filled 2019-01-21: qty 120

## 2019-01-21 MED ORDER — SODIUM BICARBONATE-DEXTROSE 150-5 MEQ/L-% IV SOLN
150.0000 meq | INTRAVENOUS | Status: DC
  Administered 2019-01-21: 06:00:00 150 meq via INTRAVENOUS
  Filled 2019-01-21 (×2): qty 1000

## 2019-01-21 MED ORDER — INSULIN ASPART 100 UNIT/ML IV SOLN: 10.0000 [IU] | Freq: Once | INTRAVENOUS | Status: DC

## 2019-01-21 MED ORDER — SODIUM CHLORIDE 0.9 % IV SOLN
1.0000 g | INTRAVENOUS | Status: AC
  Administered 2019-01-21 – 2019-01-25 (×5): 1 g via INTRAVENOUS
  Filled 2019-01-21 (×5): qty 10

## 2019-01-21 MED ORDER — SODIUM CHLORIDE 0.9 % IV SOLN
100.0000 mg | Freq: Every day | INTRAVENOUS | Status: AC
  Administered 2019-01-22 – 2019-01-25 (×4): 100 mg via INTRAVENOUS
  Filled 2019-01-21 (×4): qty 20

## 2019-01-21 MED ORDER — FENTANYL CITRATE (PF) 100 MCG/2ML IJ SOLN
100.0000 ug | Freq: Once | INTRAMUSCULAR | Status: AC
  Administered 2019-01-21: 50 ug via INTRAVENOUS
  Filled 2019-01-21: qty 2

## 2019-01-21 NOTE — ED Notes (Signed)
Patient back from CT.

## 2019-01-21 NOTE — ED Provider Notes (Signed)
I discussed the case with nephrology Dr. Royce Macadamia.  She recommends Kayexalate, bicarb amp, and bicarb drip. She also recommends Foley placement as well as CT abdomen pelvis. I have consulted critical care for evaluation for admission Patient is currently awake and alert but is tachypneic. Chest x-ray and ABG have also been ordered.   Ripley Fraise, MD 01/21/19 [phone]

## 2019-01-21 NOTE — ED Notes (Signed)
Cousin Marisa Severin wants an update [phone]

## 2019-01-21 NOTE — ED Notes (Signed)
This RN contacted Critical Care regarding this pts COVID-19 Status and LR Infusion. Refer to orders and [facility] for further actions.

## 2019-01-21 NOTE — H&P (Signed)
NAME:  Tracy Escobar, MRN:  [id number], DOB:  October 25, 1955, LOS: 0 ADMISSION DATE:  01/20/2019, CONSULTATION DATE:  01/21/19 REFERRING MD: Christy Gentles  , CHIEF COMPLAINT: Altered mental status  Brief History   64 year old female with a chief complaint of altered mental status who was found to have acute renal insufficiency and COVID-19.  PCCM consulted for admission secondary to possible dialysis  History of present illness   64 year old female with no significant past medical history who does not have a primary care doctor  lives with her nephew and was brought in for lethargy, altered mental status and "not acting like herself."  Family is concerned she may have taken too many gabapentin.  Found to have apparently acute kidney injury with creatinine of 8, potassium of 6.0 and bicarb of 10.  Head CT without acute findings.  Chest x-ray showed multifocal pneumonia and Covid-19 was positive.  She was not able to say how much gabapentin she has been taking  Of note, she presented with back pain 6 days ago and was discharged with prednisone pack and pain control.  Naprosyn is on home medication list, it is unknown how much she is taking.  ED work-up was significant for creatinine of 8.6, potassium 6.0, CO2 10, anion gap 15, white blood cell count 15.8, analysis with bacteria and WBCs, and abdomen pelvis without acute findings, Covid-19+.  She has made 500 cc of urine and been given 10 units insulin, Kayexalate, 1 amp of bicarb and PCCM was consulted for admission secondary to altered mental status and possible need for dialysis.  Past Medical History   has no past medical history on file.   Significant Hospital Events   1/9-admit to stepdown under PCCM  Consults:  Nephrology  Procedures:    Significant Diagnostic Tests:  1/9 CT head>>no acute intracranial pathology 1/9 CT abdomen pelvis>> 1 cm nonobstructing left renal inferior pole stone, no hydronephrosis, multifocal pneumonia 1/9  CXR>>Multifocal pneumonia, likely viral or atypical in etiology.  Micro Data:  1/9 urine culture>> 1/9 SARS-COV-2>>positive 1/9 influenza A and B>> negative Antimicrobials:  Ceftriaxone 1/9- Remdesivir1/9  Interim history/subjective:  Patient remains slightly agitated and confused, admit under the critical care service however given she is making urine and still on room air will place on stepdown status  Objective   Blood pressure (!) 165/98, pulse (!) 103, temperature 98.3 F (36.8 C), temperature source Rectal, resp. rate (!) 21, last menstrual period 10/30/2000, SpO2 100 %.       No intake or output data in the 24 hours ending 01/21/19 0127 There were no vitals filed for this visit.   General: Elderly female awake, slightly restless, shivering HEENT: MM pink/moist Neuro: Oriented to person only, no slurred speech, answers questions inappropriately CV: s1s2 regular rate and rhythm, no m/r/g PULM: Decreased breath sounds in bilateral bases, no wheezing no rhonchi GI: soft, bsx4 active  Extremities: warm/dry, no edema  Skin: no rashes or lesions   Resolved Hospital Problem list     Assessment & Plan:   Altered mental status likely secondary to infection with COVID-19 causing multifocal pneumonia and urinary tract infection along with uremia -CT head negative for acute findings, compensated metabolic acidosis on ABG -On room air at the time of admission P: -Check Covid inflammatory markers, initiate remdesivir and dexamethasone -Blood, urine and respiratory cultures pending -Treat UTI along with possible concurrent bacterial CAP with ceftriaxone and azithromycin -Prophylactic heparin -Oxygen support if needed   Acute renal insufficiency with NAGMA and hyperkalemia -  Creatinine 8.7 with no recent baseline, K6.0, most suspicious of prerenal volume depletion secondary to the above as the initiating cause, may also be taking NSAIDs at home P: -Nephrology following,  appreciate recommendations, patient received 1 L LR and and then started on bicarb drip -Hyperkalemia treated with insulin and Kayexalate -Monitor I's and O's and electrolytes, daughter affirms she wants dialysis if needed  Hypertension -Patient started on Norvasc 2.5 mg in the ED   Best practice:  Diet: Regular Pain/Anxiety/Delirium protocol (if indicated): N/A VAP protocol (if indicated): N/A DVT prophylaxis: Heparin GI prophylaxis: N/A Glucose control: N/A Mobility: With assist Code Status: Full code Family Communication: Spoke with daughter and updated her on patient's plan of care Disposition: ICU  Labs   CBC: Recent Labs  Lab 01/20/19 2237 01/21/19 0104  WBC 15.8*  --   HGB 10.7* 9.9*  HCT 32.0* 29.0*  MCV 84.9  --   PLT 367  --     Basic Metabolic Panel: Recent Labs  Lab 01/20/19 2237 01/21/19 0104  NA 137 139  K 6.0* 5.6*  CL 112*  --   CO2 10*  --   GLUCOSE 145*  --   BUN 148*  --   CREATININE 8.64*  --   CALCIUM 8.8*  --    GFR: CrCl cannot be calculated (Unknown ideal weight.). Recent Labs  Lab 01/20/19 2237  WBC 15.8*    Liver Function Tests: Recent Labs  Lab 01/20/19 2250  AST 16  ALT 10  ALKPHOS 42  BILITOT 0.5  PROT 6.4*  ALBUMIN 1.6*   No results for input(s): LIPASE, AMYLASE in the last 168 hours. No results for input(s): AMMONIA in the last 168 hours.  ABG    Component Value Date/Time   PHART 7.411 01/21/2019 0104   PCO2ART 18.2 (LL) 01/21/2019 0104   PO2ART 66.0 (L) 01/21/2019 0104   HCO3 11.6 (L) 01/21/2019 0104   TCO2 12 (L) 01/21/2019 0104   ACIDBASEDEF 11.0 (H) 01/21/2019 0104   O2SAT 94.0 01/21/2019 0104     Coagulation Profile: No results for input(s): INR, PROTIME in the last 168 hours.  Cardiac Enzymes: No results for input(s): CKTOTAL, CKMB, CKMBINDEX, TROPONINI in the last 168 hours.  HbA1C: No results found for: HGBA1C  CBG: Recent Labs  Lab 01/20/19 2344  GLUCAP 117*    Review of Systems:    Unable to obtain  Past Medical History  She,  has no past medical history on file.   Surgical History   No past surgical history on file.   Social History   reports that she has been smoking cigarettes. She has been smoking about 0.50 packs per day. She has never used smokeless tobacco. She reports current alcohol use.   Family History   Her family history includes Cancer in her father and mother.   Allergies No Known Allergies   Home Medications  Prior to Admission medications   Medication Sig Start Date End Date Taking? Authorizing Provider  HYDROcodone-acetaminophen (NORCO/VICODIN) 5-325 MG tablet Take 1-2 tablets by mouth every 6 (six) hours as needed. 10/31/18   Raylene Everts, MD  naproxen (NAPROSYN) 500 MG tablet Take 1 tablet (500 mg total) by mouth 2 (two) times daily. 10/31/18   Raylene Everts, MD  tiZANidine (ZANAFLEX) 4 MG tablet Take 1-2 tablets (4-8 mg total) by mouth every 6 (six) hours as needed for muscle spasms. 10/31/18   Raylene Everts, MD     Critical care time: 55 minutes  CRITICAL CARE Performed by: Otilio Carpen Dorrian Doggett   Total critical care time: 55 minutes  Critical care time was exclusive of separately billable procedures and treating other patients.  Critical care was necessary to treat or prevent imminent or life-threatening deterioration.  Critical care was time spent personally by me on the following activities: development of treatment plan with patient and/or surrogate as well as nursing, discussions with consultants, evaluation of patient's response to treatment, examination of patient, obtaining history from patient or surrogate, ordering and performing treatments and interventions, ordering and review of laboratory studies, ordering and review of radiographic studies, pulse oximetry and re-evaluation of patient's condition.   Otilio Carpen Oona Trammel, PA-C [place] [facility]  Critical carePager# [phone]

## 2019-01-21 NOTE — ED Notes (Signed)
ED TO INPATIENT HANDOFF REPORT  ED Nurse Name and Phone #: Lunette [place] Name/Age/Gender Tracy Escobar 64 y.o. female Room/Bed: 028C/028C  Code Status   Code Status: Full Code  Home/SNF/Other Home Patient oriented to: self, place and time Is this baseline? No   Triage Complete: Triage complete  Chief Complaint Renal failure [N19]  Triage Note Patient brought in by family with altered mental status, says she is not acting herself and concerned she may have taken too many gabapentin. The patient is restless, difficult to redirect at time, slurred speech. Says she took gabapentin, will not answer to how many.   Bary Richard (pt daughter [phone]) [place] [id number]    Allergies No Known Allergies  Level of Care/Admitting Diagnosis ED Disposition    ED Disposition Condition [facility] Area: [place] [100100]  Level of Care: ICU [6]  Covid Evaluation: Asymptomatic Screening Protocol (No Symptoms)  Diagnosis: Renal failure [id number]  Admitting Physician: Tally Due [id number]  Attending Physician: Tally Due [id number]  Estimated length of stay: past midnight tomorrow  Certification:: I certify this patient will need inpatient services for at least 2 midnights       B Medical/Surgery History No past medical history on file. No past surgical history on file.   A IV Location/Drains/Wounds Patient Lines/Drains/Airways Status   Active Line/Drains/Airways    Name:   Placement date:   Placement time:   Site:   Days:   Peripheral IV 01/21/19 Left Antecubital   01/21/19    0018    Antecubital   less than 1   Peripheral IV 01/21/19 Right Antecubital   01/21/19    0042    Antecubital   less than 1   Urethral Catheter Iris Hairston, RN Straight-tip 16 Fr.   01/21/19    0043    Straight-tip   less than 1          Intake/Output Last 24 hours No intake or output data in the 24 hours ending 01/21/19  0208  Labs/Imaging Results for orders placed or performed during the hospital encounter of 01/20/19 (from the past 48 hour(s))  Basic metabolic panel     Status: Abnormal   Collection Time: 01/20/19 10:37 PM  Result Value Ref Range   Sodium 137 135 - 145 mmol/L   Potassium 6.0 (H) 3.5 - 5.1 mmol/L   Chloride 112 (H) 98 - 111 mmol/L   CO2 10 (L) 22 - 32 mmol/L   Glucose, Bld 145 (H) 70 - 99 mg/dL   BUN 148 (H) 8 - 23 mg/dL   Creatinine, Ser 8.64 (H) 0.44 - 1.00 mg/dL   Calcium 8.8 (L) 8.9 - 10.3 mg/dL   GFR calc non Af Amer 4 (L) >60 mL/min   GFR calc Af Amer 5 (L) >60 mL/min   Anion gap 15 5 - 15    Comment: Performed at [facility], [address], [place], [place] [address]  CBC     Status: Abnormal   Collection Time: 01/20/19 10:37 PM  Result Value Ref Range   WBC 15.8 (H) 4.0 - 10.5 K/uL   RBC 3.77 (L) 3.87 - 5.11 MIL/uL   Hemoglobin 10.7 (L) 12.0 - 15.0 g/dL   HCT 32.0 (L) 36.0 - 46.0 %   MCV 84.9 80.0 - 100.0 fL   MCH 28.4 26.0 - 34.0 pg   MCHC 33.4 30.0 - 36.0 g/dL   RDW 16.7 (H) 11.5 -  15.5 %   Platelets 367 150 - 400 K/uL   nRBC 0.0 0.0 - 0.2 %    Comment: Performed at [facility], [place] [address] [place], [place] [id number]  Salicylate Level     Status: Abnormal   Collection Time: 01/20/19 10:50 PM  Result Value Ref Range   Salicylate Lvl [id number] (L) 7.0 - 30.0 mg/dL    Comment: Performed at [facility] [address] [place], [place]  [address]  Tylenol Level     Status: Abnormal   Collection Time: 01/20/19 10:50 PM  Result Value Ref Range   Acetaminophen (Tylenol), Serum <10 (L) 10 - 30 ug/mL    Comment: (NOTE) Therapeutic concentrations vary significantly. A range of 10-30 ug/mL  may be an effective concentration for many patients. However, some  are best treated at concentrations outside of this range. Acetaminophen concentrations >150 ug/mL at 4 hours after ingestion  and >50 ug/mL at 12 hours after ingestion are often associated with   toxic reactions. Performed at [facility], [place] [address], [place], [place] [address]   Ethanol     Status: None   Collection Time: 01/20/19 10:50 PM  Result Value Ref Range   Alcohol, Ethyl (B) <10 <10 mg/dL    Comment: (NOTE) Lowest detectable limit for serum alcohol is 10 mg/dL. For medical purposes only. Performed at [facility], [place] [address] [place], [place] [address]   Hepatic function panel     Status: Abnormal   Collection Time: 01/20/19 10:50 PM  Result Value Ref Range   Total Protein 6.4 (L) 6.5 - 8.1 g/dL   Albumin 1.6 (L) 3.5 - 5.0 g/dL   AST 16 15 - 41 U/L   ALT 10 0 - 44 U/L   Alkaline Phosphatase 42 38 - 126 U/L   Total Bilirubin 0.5 0.3 - 1.2 mg/dL   Bilirubin, Direct <0.1 0.0 - 0.2 mg/dL   Indirect Bilirubin NOT CALCULATED 0.3 - 0.9 mg/dL    Comment: Performed at [facility] [address] [place], [place] [address]  CBG monitoring, ED     Status: Abnormal   Collection Time: 01/20/19 11:44 PM  Result Value Ref Range   Glucose-Capillary 117 (H) 70 - 99 mg/dL  Urinalysis, Routine w reflex microscopic     Status: Abnormal   Collection Time: 01/21/19 12:47 AM  Result Value Ref Range   Color, Urine AMBER (A) YELLOW    Comment: BIOCHEMICALS MAY BE AFFECTED BY COLOR   APPearance CLOUDY (A) CLEAR   Specific Gravity, Urine 1.034 (H) 1.005 - 1.030   pH 5.0 5.0 - 8.0   Glucose, UA NEGATIVE NEGATIVE mg/dL   Hgb urine dipstick NEGATIVE NEGATIVE   Bilirubin Urine NEGATIVE NEGATIVE   Ketones, ur NEGATIVE NEGATIVE mg/dL   Protein, ur 100 (A) NEGATIVE mg/dL   Nitrite NEGATIVE NEGATIVE   Leukocytes,Ua TRACE (A) NEGATIVE   RBC / HPF 0-5 0 - 5 RBC/hpf   WBC, UA >50 (H) 0 - 5 WBC/hpf   Bacteria, UA MANY (A) NONE SEEN   WBC Clumps PRESENT     Comment: Performed at [facility], [address] [place], [place] [address]  I-STAT 7, (LYTES, BLD GAS, ICA, H+H)     Status: Abnormal   Collection Time: 01/21/19  1:04 AM  Result Value Ref  Range   pH, Arterial 7.411 7.350 - 7.450   pCO2 arterial 18.2 (LL) 32.0 - 48.0 mmHg  pO2, Arterial 66.0 (L) 83.0 - 108.0 mmHg   Bicarbonate 11.6 (L) 20.0 - 28.0 mmol/L   TCO2 12 (L) 22 - 32 mmol/L   O2 Saturation 94.0 %   Acid-base deficit 11.0 (H) 0.0 - 2.0 mmol/L   Sodium 139 135 - 145 mmol/L   Potassium 5.6 (H) 3.5 - 5.1 mmol/L   Calcium, Ion 1.18 1.15 - 1.40 mmol/L   HCT 29.0 (L) 36.0 - 46.0 %   Hemoglobin 9.9 (L) 12.0 - 15.0 g/dL   Patient temperature 98.1 F    Collection site RADIAL, ALLEN'S TEST ACCEPTABLE    Drawn by RT    Sample type ARTERIAL    Comment NOTIFIED PHYSICIAN   CBG monitoring, ED     Status: Abnormal   Collection Time: 01/21/19  1:51 AM  Result Value Ref Range   Glucose-Capillary 137 (H) 70 - 99 mg/dL   Comment 1 Notify RN    Comment 2 Document in Chart    CT Head Wo Contrast  Result Date: 01/20/2019 CLINICAL DATA:  64 year old female with altered mental status. EXAM: CT HEAD WITHOUT CONTRAST TECHNIQUE: Contiguous axial images were obtained from the base of the skull through the vertex without intravenous contrast. COMPARISON:  None. FINDINGS: Brain: The ventricles and sulci appropriate size for patient's age. There is mild periventricular and deep white matter chronic microvascular ischemic changes. There is no acute intracranial hemorrhage. No mass effect or midline shift. No extra-axial fluid collection. Vascular: No hyperdense vessel or unexpected calcification. Skull: Normal. Negative for fracture or focal lesion. Sinuses/Orbits: The visualized paranasal sinuses and the right mastoid air cells are clear. There is partial opacification of the left mastoid air cells. No air-fluid level Other: None IMPRESSION: 1. No acute intracranial pathology. 2. Mild age-related atrophy and chronic microvascular ischemic changes. Electronically Signed   By: Anner Crete M.D.   On: 01/20/2019 23:21   DG Chest Port 1 View  Result Date: 01/21/2019 CLINICAL DATA:  64 year old  female with shortness of breath. EXAM: PORTABLE CHEST 1 VIEW COMPARISON:  None. FINDINGS: Bilateral patchy and streaky densities most consistent with multifocal pneumonia, likely viral or atypical in etiology. Clinical correlation is recommended. There is no pleural effusion or pneumothorax. The cardiac silhouette is within normal limits. No acute osseous pathology. IMPRESSION: Multifocal pneumonia, likely viral or atypical in etiology. Electronically Signed   By: Anner Crete M.D.   On: 01/21/2019 01:01    Pending Labs Unresulted Labs (From admission, onward)    Start     Ordered   01/21/19 [id number]  Basic metabolic panel  Once,   STAT     01/21/19 0035   01/21/19 0131  Expectorated sputum assessment w rflx to resp cult  Once,   R    Question:  Patient immune status  Answer:  Normal   01/21/19 0130   01/21/19 0130  Lactic acid, plasma  STAT Now then every 3 hours,   R (with STAT occurrences)     01/21/19 0129   01/21/19 0126  Culture, blood (routine x 2)  BLOOD CULTURE X 2,   R (with STAT occurrences)     01/21/19 0125   01/21/19 0122  HIV Antibody (routine testing w rflx)  (HIV Antibody (Routine testing w reflex) panel)  Once,   STAT     01/21/19 0124   01/21/19 0122  CBC  (heparin)  Once,   STAT    Comments: Baseline for heparin therapy IF NOT ALREADY DRAWN.  Notify MD if PLT <  100 K.    01/21/19 0124   01/21/19 0048  Respiratory Panel by RT PCR (Flu A&B, Covid) - Nasopharyngeal Swab  (Tier 2 Respiratory Panel by RT PCR (Flu A&B, Covid) (TAT 2 hrs))  Once,   STAT    Question Answer Comment  Is this test for diagnosis or screening Screening   Symptomatic for COVID-19 as defined by CDC No   Hospitalized for COVID-19 No   Admitted to ICU for COVID-19 No   Previously tested for COVID-19 No   Resident in a congregate (group) care setting No   Employed in healthcare setting No   Pregnant No      01/21/19 0047   01/21/19 0040  Urine culture  ONCE - STAT,   STAT     01/21/19 0039           Vitals/Pain Today's Vitals   01/21/19 0100 01/21/19 0106 01/21/19 0115 01/21/19 0130  BP: (!) 165/98  (!) 167/100 (!) 153/112  Pulse: (!) 103  (!) 104 (!) 106  Resp: (!) 21  (!) 32 (!) 26  Temp:  98.3 F (36.8 C)    TempSrc:  Rectal    SpO2: 100%  100% 100%    Isolation Precautions No active isolations  Medications Medications  heparin injection 5,000 Units (has no administration in time range)  lactated ringers infusion ( Intravenous New Bag/Given 01/21/19 0200)  sodium chloride flush (NS) 0.9 % injection 3 mL (3 mLs Intravenous Given 01/21/19 0059)  sodium bicarbonate injection 50 mEq (50 mEq Intravenous Given 01/21/19 0050)  sodium polystyrene (KAYEXALATE) 15 GM/60ML suspension 30 g (30 g Oral Given 01/21/19 0053)  lactated ringers bolus 1,000 mL (1,000 mLs Intravenous New Bag/Given 01/21/19 0200)    Mobility walks     Focused Assessments    R Recommendations: See Admitting Provider Note  Report given to:   Additional Notes: N/A

## 2019-01-21 NOTE — ED Provider Notes (Signed)
Patient has been seen by critical care.  She will be admitted to the ICU.  She is awaiting a CT scan.  She is awake alert at this time.   Ripley Fraise, MD 01/21/19 [phone]

## 2019-01-21 NOTE — Consult Note (Addendum)
[facility] Renal Consultation Note  Requesting MD: Ripley Fraise, MD Indication for Consultation: Acute kidney injury  Chief complaint: AMS  HPI:  Tracy Escobar is a 64 y.o. female with little past medical history who presented to the hospital with confusion per family.  She was found to have acute kidney injury and a creatinine of 8.6.  Foley was placed at my request.  CT abdomen was obtained without hydro-.  She was started on fluids. I personally ordered a bicarbonate infusion.  On chart review it appears that this was discontinued and the patient received lactated Ringer's.  She is also received normal saline.  She received Kayexalate and has had bowel movements per nursing.  She has not wanted to talk much this morning.   she denied taking medications at home however has Naprosyn listed on her home medication list.  She was found to have Covid pneumonia.  Examined in person with appropriate PPE.  I spoke with her daughter.  Pt lives with a family member and she presented to the ER because family noticed confusion - "she wasn't able to recognize anyone - she was out of it".  She was in pain; no n/v or shortness of breath that her daughter was aware of.  Her daughter doesn't think that she has been eating well.  doesn't know of any meds but does state pt has HTN (pt denies knowing of HTN). Her daughter is her next of kin and would want her to have dialysis if indicated; we discussed risks/benefits/indications and that we anticipate need in next 24 hours.  UA concerning for UTI with cx sent per team and 100 mgdl protein.  Creatinine, Ser  Date/Time Value Ref Range Status  01/21/2019 03:05 AM 8.87 (H) 0.44 - 1.00 mg/dL Final  01/20/2019 10:37 PM 8.64 (H) 0.44 - 1.00 mg/dL Final  11/14/2007 03:01 PM 1.2  Final     PMHx: Her daughter states she has HTN (patient had denied any known history of hypertension) Limited secondary to mental status however patient denies taking any  chronic medications at home No known CKD   Family Hx:  Family History  Problem Relation Age of Onset  . Cancer Mother   . Cancer Father   Denies family history of CKD Daughter states family hx DM  Social History:  reports that she has been smoking cigarettes. She has been smoking about 0.50 packs per day. She has never used smokeless tobacco. She reports current alcohol use. No history on file for drug.  Allergies: No Known Allergies  Medications: Prior to Admission medications   Medication Sig Start Date End Date Taking? Authorizing Provider  HYDROcodone-acetaminophen (NORCO/VICODIN) 5-325 MG tablet Take 1-2 tablets by mouth every 6 (six) hours as needed. 10/31/18   Raylene Everts, MD  naproxen (NAPROSYN) 500 MG tablet Take 1 tablet (500 mg total) by mouth 2 (two) times daily. 10/31/18   Raylene Everts, MD  tiZANidine (ZANAFLEX) 4 MG tablet Take 1-2 tablets (4-8 mg total) by mouth every 6 (six) hours as needed for muscle spasms. 10/31/18   Raylene Everts, MD    I have reviewed the patient's current medications.  Labs:  BMP Latest Ref Rng & Units 01/21/2019 01/21/2019 01/20/2019  Glucose 70 - 99 mg/dL 142(H) - 145(H)  BUN 8 - 23 mg/dL 150(H) - 148(H)  Creatinine 0.44 - 1.00 mg/dL 8.87(H) - 8.64(H)  Sodium 135 - 145 mmol/L 140 139 137  Potassium 3.5 - 5.1 mmol/L 6.0(H) 5.6(H) 6.0(H)  Chloride 98 - 111 mmol/L 114(H) - 112(H)  CO2 22 - 32 mmol/L 13(L) - 10(L)  Calcium 8.9 - 10.3 mg/dL 8.4(L) - 8.8(L)    Urinalysis    Component Value Date/Time   COLORURINE AMBER (A) 01/21/2019 0047   APPEARANCEUR CLOUDY (A) 01/21/2019 0047   LABSPEC 1.034 (H) 01/21/2019 0047   PHURINE 5.0 01/21/2019 [place] 01/21/2019 0047   HGBUR NEGATIVE 01/21/2019 0047   [place] 01/21/2019 [place] 01/21/2019 0047   PROTEINUR 100 (A) 01/21/2019 0047   NITRITE NEGATIVE 01/21/2019 0047   LEUKOCYTESUR TRACE (A) 01/21/2019 0047     ROS: Limited  secondary to mental status  Physical Exam: Vitals:   01/21/19 0130 01/21/19 0330  BP: (!) 153/112 (!) 160/77  Pulse: (!) 106 88  Resp: (!) 26 (!) 25  Temp:    SpO2: 100% 100%     General: Adult female in bed in no acute distress at rest HEENT: Normocephalic atraumatic Eyes: Extraocular movements intact sclera anicteric Neck supple trachea midline Heart: S1-S2 no rub Lungs: Reduced breath sounds with normal work of breathing at rest on room air Abdomen: Soft nontender nondistended Extremities: No edema appreciated no cyanosis or clubbing Skin: No rash on extremities exposed, decreased skin turgor Neuro: Patient is awake and is not oriented to the year GU Foley catheter is in place  Assessment/Plan:  # AKI - Secondary to Covid and prerenal insults as well as likely NSAID use per med list.  CT scan without hydro.  Baseline cr recently is unknown but 1.2 in 2009 - I have reordered sodium bicarbonate infusion - Continue Foley - Strict ins/outs - up/c ratio - BMP later today 10 am and anticipate need for HD soon if not starting to improve - she had not really received a trial of fluids prior to the repeat AM labs  # Hyperkalemia - Have reordered sodium bicarbonate infusion - Status post Kayexalate  # Metabolic acidosis - S/p bicarb amp x 2 - Sodium bicarbonate infusion reordered  # COVID-19 infection - Therapies per primary team  # Altered mental status - Secondary likely to uremia as well as Covid, metabolic acidosis  # HTN - Amlodipine 2.5 mg daily for now   Claudia Desanctis 01/21/2019, 5:13 AM

## 2019-01-21 NOTE — Procedures (Signed)
HD Catheter Procedure Note Tracy Escobar [id number] December 29, 1955  Procedure: HD Catheter insertion-right IJ Indications: acute renal failure  Procedure Details Consent: Risks of procedure as well as the alternatives and risks of each were explained to the (patient/caregiver).  Consent for procedure obtained. Time Out: Verified patient identification, verified procedure, site/side was marked, verified correct patient position, special equipment/implants available, medications/allergies/relevent history reviewed, required imaging and test results available.  Performed  Drugs:  50 mcg Fentanyl, 1 mg Versed,  DESCRIPTION OF PROCEDURE: After timeout, pt positioned in Trendelenberg, mild sedation administered by nurse under my direction. Wide R neck/clavicle prep with Chloraprep. Sterile gown, gloves, COVID barrier precautions, large sterile drape applied.  Ultrasound visualization of RIJ and cath with 20g needle. Venous return through needle and wire threaded easily, no ectopy or cardiac instability. Dilated and passed 15 cm Trialysis catheter using Seldinger technique. Sutured to skin, Biopatch placed and sterile dressing applied.   EBL: ~25cc  Evaluation Hemodynamic Status: BP stable throughout; O2 sats: stable throughout Patient's Current Condition: stable Complications: No apparent complications Patient did tolerate procedure well. Chest X-ray ordered to verify placement.  CXR: pending.  Bonna Gains, MD PhD 6:25 PM

## 2019-01-22 LAB — CBC
HCT: 29.1 % — ABNORMAL LOW (ref 36.0–46.0)
Hemoglobin: 10 g/dL — ABNORMAL LOW (ref 12.0–15.0)
MCH: 27.2 pg (ref 26.0–34.0)
MCHC: 34.4 g/dL (ref 30.0–36.0)
MCV: 79.1 fL — ABNORMAL LOW (ref 80.0–100.0)
Platelets: 418 10*3/uL — ABNORMAL HIGH (ref 150–400)
RBC: 3.68 MIL/uL — ABNORMAL LOW (ref 3.87–5.11)
RDW: 14.7 % (ref 11.5–15.5)
WBC: 12.7 10*3/uL — ABNORMAL HIGH (ref 4.0–10.5)
nRBC: 0 % (ref 0.0–0.2)

## 2019-01-22 LAB — RENAL FUNCTION PANEL
Albumin: 1.3 g/dL — ABNORMAL LOW (ref 3.5–5.0)
Anion gap: 15 (ref 5–15)
BUN: 144 mg/dL — ABNORMAL HIGH (ref 8–23)
CO2: 21 mmol/L — ABNORMAL LOW (ref 22–32)
Calcium: 8 mg/dL — ABNORMAL LOW (ref 8.9–10.3)
Chloride: 103 mmol/L (ref 98–111)
Creatinine, Ser: 9.52 mg/dL — ABNORMAL HIGH (ref 0.44–1.00)
GFR calc Af Amer: 5 mL/min — ABNORMAL LOW (ref >60)
GFR calc non Af Amer: 4 mL/min — ABNORMAL LOW (ref >60)
Glucose, Bld: 214 mg/dL — ABNORMAL HIGH (ref 70–99)
Phosphorus: 10.7 mg/dL — ABNORMAL HIGH (ref 2.5–4.6)
Potassium: 4 mmol/L (ref 3.5–5.1)
Sodium: 139 mmol/L (ref 135–145)

## 2019-01-22 LAB — URINE CULTURE

## 2019-01-22 LAB — HEPATITIS B SURFACE ANTIGEN: Hepatitis B Surface Ag: NONREACTIVE

## 2019-01-22 LAB — HEPATITIS B CORE ANTIBODY, TOTAL: Hep B Core Total Ab: NONREACTIVE

## 2019-01-22 LAB — HEPATITIS B SURFACE ANTIBODY,QUALITATIVE: Hep B S Ab: REACTIVE — AB

## 2019-01-22 MED ORDER — ALTEPLASE 2 MG IJ SOLR: 2.0000 mg | Freq: Once | INTRAMUSCULAR | Status: DC | PRN

## 2019-01-22 MED ORDER — LIDOCAINE-PRILOCAINE 2.5-2.5 % EX CREA
1.0000 "application " | TOPICAL_CREAM | CUTANEOUS | Status: DC | PRN
  Filled 2019-01-22: qty 5

## 2019-01-22 MED ORDER — SODIUM CHLORIDE 0.9 % IV SOLN: 100.0000 mL | INTRAVENOUS | Status: DC | PRN

## 2019-01-22 MED ORDER — AMLODIPINE BESYLATE 5 MG PO TABS
5.0000 mg | ORAL_TABLET | Freq: Every day | ORAL | Status: DC
  Administered 2019-01-23 – 2019-02-03 (×12): 5 mg via ORAL
  Filled 2019-01-22 (×11): qty 1

## 2019-01-22 MED ORDER — AMLODIPINE BESYLATE 5 MG PO TABS
2.5000 mg | ORAL_TABLET | Freq: Once | ORAL | Status: AC
  Administered 2019-01-22: 16:00:00 2.5 mg via ORAL
  Filled 2019-01-22: qty 1

## 2019-01-22 MED ORDER — LIDOCAINE HCL (PF) 1 % IJ SOLN
5.0000 mL | INTRAMUSCULAR | Status: DC | PRN
  Filled 2019-01-22: qty 5

## 2019-01-22 MED ORDER — HEPARIN SODIUM (PORCINE) 1000 UNIT/ML DIALYSIS
1000.0000 [IU] | INTRAMUSCULAR | Status: DC | PRN
  Administered 2019-01-30: 13:00:00 1000 [IU] via INTRAVENOUS_CENTRAL
  Filled 2019-01-22: qty 1

## 2019-01-22 MED ORDER — PENTAFLUOROPROP-TETRAFLUOROETH EX AERO
1.0000 "application " | INHALATION_SPRAY | CUTANEOUS | Status: DC | PRN
  Filled 2019-01-22: qty 116

## 2019-01-22 NOTE — ED Notes (Signed)
Please call daughter [phone]

## 2019-01-22 NOTE — Progress Notes (Signed)
[facility] Progress Note  Name: Tracy Escobar MRN: [id number] DOB: 02-Nov-1955  Chief Complaint:  AMS  Subjective:  She had a nontunneled catheter placed in the ER per CCM - appreciate their assistance.  She is still in the ER awaiting a bed.  Feels better today.  She is more alert today but does insist is 2020.  Review of systems:  Doesn't have an appetite  Denies shortness of breath or chest pain No n/v States not eating much   ------  Background on consult:  Tracy Escobar is a 64 y.o. female with little past medical history who presented to the hospital with confusion per family.  She was found to have acute kidney injury and a creatinine of 8.6.  Foley was placed at my request.  CT abdomen was obtained without hydro-.  She was started on fluids. I personally ordered a bicarbonate infusion.  On chart review it appears that this was discontinued and the patient received lactated Ringer's.  She is also received normal saline.  She received Kayexalate and has had bowel movements per nursing.  She has not wanted to talk much this morning.   she denied taking medications at home however has Naprosyn listed on her home medication list.  She was found to have Covid pneumonia.  Examined in person with appropriate PPE.  I spoke with her daughter.  Pt lives with a family member and she presented to the ER because family noticed confusion - "she wasn't able to recognize anyone - she was out of it".  She was in pain; no n/v or shortness of breath that her daughter was aware of.  Her daughter doesn't think that she has been eating well.  doesn't know of any meds but does state pt has HTN (pt denies knowing of HTN). Her daughter is her next of kin and would want her to have dialysis if indicated; we discussed risks/benefits/indications and that we anticipate need in next 24 hours.  UA concerning for UTI with cx sent per team and 100 mgdl protein.   Intake/Output Summary (Last 24 hours) at  01/22/2019 1208 Last data filed at 01/22/2019 1110 Gross per 24 hour  Intake 550 ml  Output 125 ml  Net 425 ml    Vitals:  Vitals:   01/22/19 1045 01/22/19 1100 01/22/19 1115 01/22/19 1130  BP: (!) 168/92 (!) 165/97 (!) 169/86 (!) 174/98  Pulse: 72 76 68 70  Resp: 17 (!) 22 19 (!) 23  Temp:      TempSrc:      SpO2: 92% 93% 92% 92%     Physical Exam:  General adult female in bed in no acute distress HEENT normocephalic atraumatic extraocular movements intact sclera anicteric Neck supple trachea midline Lungs clear to auscultation bilaterally normal work of breathing at rest on room air Heart regular rate and rhythm no rubs  Abdomen soft nontender nondistended Extremities no pitting edema  Psych normal mood and affect Access RIJ nontunneled dialysis catheter Neuro awake and oriented to person and location but states is Jan 2020 despite multiple attempts  GU foley in place with 300 mL urine    Medications reviewed   Labs:  BMP Latest Ref Rng & Units 01/21/2019 01/21/2019 01/21/2019  Glucose 70 - 99 mg/dL 189(H) 126(H) -  BUN 8 - 23 mg/dL 142(H) 147(H) -  Creatinine 0.44 - 1.00 mg/dL 9.14(H) 8.86(H) 9.04(H)  Sodium 135 - 145 mmol/L 142 142 -  Potassium 3.5 - 5.1 mmol/L 4.1 4.9 -  Chloride 98 - 111 mmol/L 109 113(H) -  CO2 22 - 32 mmol/L 17(L) 14(L) -  Calcium 8.9 - 10.3 mg/dL 7.8(L) 8.0(L) -    Assessment/Plan:   # AKI - Secondary to Covid and prerenal insults as well as likely NSAID use per med list.  CT scan without hydro.  Baseline cr recently is unknown but 1.2 in 2009 - s/p nontunneled catheter placement and plan for HD  - HD today anticipate treatment late overnight.  Will need HD on 1/12 as well and will need to assess dialysis needs daily - Continue Foley - Strict ins/outs - UOP appears ? Improving a bit - repeat UP/cr ratio  # Hyperkalemia - improved  - update renal panel and ordered for daily  - For HD   # Metabolic acidosis - s/p sodium bicarb infusion.  Will discontinue for now - update renal panel   # COVID-19 infection - Therapies per primary team - remdesivir and decadron  # Altered mental status - Secondary likely to uremia as well as Covid, metabolic acidosis  # HTN - Increase amlodipine to 5 mg daily   - Discontinue fluids for now   # Anemia - normocytic - update CBC - anticipate need for ESA  Updated her daughter 1/9 - unable to reach her by phone on 1/10.   Claudia Desanctis, MD 01/22/2019 12:08 PM

## 2019-01-23 DIAGNOSIS — N179 Acute kidney failure, unspecified: Secondary | ICD-10-CM

## 2019-01-23 LAB — RENAL FUNCTION PANEL
Albumin: 1.4 g/dL — ABNORMAL LOW (ref 3.5–5.0)
Anion gap: 15 (ref 5–15)
BUN: 106 mg/dL — ABNORMAL HIGH (ref 8–23)
CO2: 22 mmol/L (ref 22–32)
Calcium: 7.7 mg/dL — ABNORMAL LOW (ref 8.9–10.3)
Chloride: 100 mmol/L (ref 98–111)
Creatinine, Ser: 7.88 mg/dL — ABNORMAL HIGH (ref 0.44–1.00)
GFR calc Af Amer: 6 mL/min — ABNORMAL LOW (ref >60)
GFR calc non Af Amer: 5 mL/min — ABNORMAL LOW (ref >60)
Glucose, Bld: 138 mg/dL — ABNORMAL HIGH (ref 70–99)
Phosphorus: 8.6 mg/dL — ABNORMAL HIGH (ref 2.5–4.6)
Potassium: 3.8 mmol/L (ref 3.5–5.1)
Sodium: 137 mmol/L (ref 135–145)

## 2019-01-23 LAB — PROCALCITONIN: Procalcitonin: 3.63 ng/mL

## 2019-01-23 MED ORDER — FUROSEMIDE 10 MG/ML IJ SOLN
160.0000 mg | Freq: Once | INTRAVENOUS | Status: AC
  Administered 2019-01-23: 18:00:00 160 mg via INTRAVENOUS
  Filled 2019-01-23: qty 16

## 2019-01-23 MED ORDER — INFLUENZA VAC SPLIT QUAD 0.5 ML IM SUSY
0.5000 mL | PREFILLED_SYRINGE | INTRAMUSCULAR | Status: AC
  Administered 2019-02-05: 14:00:00 0.5 mL via INTRAMUSCULAR
  Filled 2019-01-23: qty 0.5

## 2019-01-23 MED ORDER — HEPARIN SODIUM (PORCINE) 5000 UNIT/ML IJ SOLN
7500.0000 [IU] | Freq: Three times a day (TID) | INTRAMUSCULAR | Status: DC
  Administered 2019-01-23 – 2019-01-25 (×6): 7500 [IU] via SUBCUTANEOUS
  Filled 2019-01-23 (×6): qty 2

## 2019-01-23 NOTE — Progress Notes (Addendum)
[facility] Progress Note   Subjective:    Ms.Cousin is a 64 yo F w/ PMh of HTN admit for COVID pneumonia on hospital day 2.   Ms.Goldwater was examined and evaluated at bedside this am. She was observed laying prone in bed. She mentions feeling discomfort due to prone position but otherwise has no new complaint. She is AAOx3 and mentions that she does not take any medications at home and states she has not seen a doctor 'for a long time.'She denies any fevers or chills. Denies any chest pain, palpitations.  She received dialysis late last night/ early AM for uremia -  900 UOP  Objective Vitals:   01/23/19 0341 01/23/19 0434 01/23/19 0436 01/23/19 0747  BP: (!) 174/92 (!) 152/81 (!) 152/81   Pulse: 85 82    Resp: (!) 26 20    Temp: 97.6 F (36.4 C)  98.2 F (36.8 C)   TempSrc: Oral     SpO2: 94% 92%    Weight:    77.8 kg  Height:    5\' 2"  (1.575 m)   Physical Exam General: Well-nourished, NAD Heart: Unable to auscultate due to prone position Lungs: Bilateral rhonci, no wheeze Extremities: Peripheral pulses present, trace lower extremity edema Dialysis Access: Right IJ tunneled cath  Additional Objective Labs: Basic Metabolic Panel: Recent Labs  Lab 01/21/19 1607 01/22/19 1220 01/23/19 0614  NA 142 139 137  K 4.1 4.0 3.8  CL 109 103 100  CO2 17* 21* 22  GLUCOSE 189* 214* 138*  BUN 142* 144* 106*  CREATININE 9.14* 9.52* 7.88*  CALCIUM 7.8* 8.0* 7.7*  PHOS  --  10.7* 8.6*   Liver Function Tests: Recent Labs  Lab 01/20/19 2250 01/22/19 1220 01/23/19 0614  AST 16  --   --   ALT 10  --   --   ALKPHOS 42  --   --   BILITOT 0.5  --   --   PROT 6.4*  --   --   ALBUMIN 1.6* 1.3* 1.4*   No results for input(s): LIPASE, AMYLASE in the last 168 hours. CBC: Recent Labs  Lab 01/20/19 2237 01/21/19 0122 01/21/19 0547 01/22/19 2110  WBC 15.8* 18.0* 12.8* 12.7*  HGB 10.7* 10.0* 9.2* 10.0*  HCT 32.0* 29.6* 27.1* 29.1*  MCV 84.9 84.6 82.9 79.1*  PLT 367  600* 341 418*   Blood Culture    Component Value Date/Time   SDES BLOOD RIGHT ANTECUBITAL 01/21/2019 0147   SPECREQUEST  01/21/2019 0147    BOTTLES DRAWN AEROBIC AND ANAEROBIC Blood Culture results may not be optimal due to an inadequate volume of blood received in culture bottles   CULT  01/21/2019 0147    NO GROWTH 2 DAYS Performed at [facility] [address], [place], [place] [address]    REPTSTATUS PENDING 01/21/2019 0147    Cardiac Enzymes: Recent Labs  Lab 01/21/19 0547  CKTOTAL 65   CBG: Recent Labs  Lab 01/20/19 2344 01/21/19 0151  GLUCAP 117* 137*   Iron Studies:  Recent Labs    01/21/19 0547  FERRITIN 832*   Studies/Results: DG Chest Portable 1 View  Result Date: 01/21/2019 CLINICAL DATA:  Vascular catheter placement. EXAM: PORTABLE CHEST 1 VIEW COMPARISON:  Chest radiograph dated 01/21/2019. FINDINGS: There has been interval placement of a right internal jugular vascular sheath with tip overlying the superior vena cava. The heart is normal in size. There is no pneumothorax. There is no pleural effusion. Patchy bilateral  airspace opacities are not significantly changed. IMPRESSION: 1. Right internal jugular vascular sheath with tip overlying the superior vena cava. Electronically Signed   By: Zerita Boers M.D.   On: 01/21/2019 18:54   Medications: . sodium chloride    . sodium chloride    . azithromycin Stopped (01/22/19 1012)  . cefTRIAXone (ROCEPHIN)  IV 1 g (01/23/19 0544)  . remdesivir 100 mg in NS 100 mL Stopped (01/22/19 1110)   . amLODipine  5 mg Oral Daily  . Chlorhexidine Gluconate Cloth  6 each Topical Q0600  . dexamethasone (DECADRON) injection  6 mg Intravenous Q24H  . heparin  5,000 Units Subcutaneous Q8H  . [START ON 01/24/2019] influenza vac split quadrivalent PF  0.5 mL Intramuscular Tomorrow-1000    Dialysis Orders: N/A  Assessment/Plan: 1. Acute Kidney Injury: Likely due to COVID pneumonia w/ pre-renal azotemia. Questionable  NSAID use at home. On admission. BUN 147, Creatinine 9 with acidosis and hyperkalemia. Unable to ascertain baseline due to no prior records. Mentions not seeing healthcare providers regularly. HD last night (2hrs, 500cc out) w/ resolution of hyperkalemia and acidosis. Am labs: K 3.8 Co2 22, BUN 106, Creatinine 7.88, Phos 8.6. Urine output is encouraging for resolution of AKI (500cc overnight) Will trial furosemide today to see if she will respond to diuretic therapy and follow labs.  - IV furosemide once 160mg  - Trend renal fx - Daily assessment for intermittent HD as needed - Avoid nephrotoxic meds - Strict I&Os, Daily Weights  2. COVID-19 infection: Currently on prone position. On remdesivir, decardron, azithromycin and ceftriaxone - Per primary  3. HTN: Am bp 152/82 on Amlodipine - Should improve with diuresis  4. Anemia hgb 10.0. Microcytic. Stable. Can get iron panel once COVID pneumonia resolves  5. Nutrition: Can start renal diet once respiratory status is stable  Gilberto Better, MD PGY-2 [facility] Pager: [phone]  Attending attestation to follow  Patient seen and examined, agree with above note with above modifications. Pt presented with crt 9 in the setting of COVID PNA.  No baseline renal function is known.  Urine looks like UTI- on rocephin.  Got first HD treatment late last night for uremia and acidosis. Labs better this AM and made 900 of urine.  Will give her lasix and trend labs  Corliss Parish, MD 01/23/2019

## 2019-01-23 NOTE — H&P (Signed)
NAME:  Tracy Escobar, MRN:  [id number], DOB:  05-Nov-1955, LOS: 2 ADMISSION DATE:  01/20/2019, CONSULTATION DATE:  01/23/19 REFERRING MD: Christy Gentles  , CHIEF COMPLAINT: Altered mental status  Brief History   64 year old female with a chief complaint of altered mental status who was found to have acute renal insufficiency and COVID-19.  PCCM consulted for admission secondary to possible dialysis  History of present illness   64 year old female with no significant past medical history who does not have a primary care doctor  lives with her nephew and was brought in for lethargy, altered mental status and "not acting like herself."  Family is concerned she may have taken too many gabapentin.  Found to have apparently acute kidney injury with creatinine of 8, potassium of 6.0 and bicarb of 10.  Head CT without acute findings.  Chest x-ray showed multifocal pneumonia and Covid-19 was positive.  She was not able to say how much gabapentin she has been taking  Of note, she presented with back pain 6 days ago and was discharged with prednisone pack and pain control.  Naprosyn is on home medication list, it is unknown how much she is taking.  ED work-up was significant for creatinine of 8.6, potassium 6.0, CO2 10, anion gap 15, white blood cell count 15.8, analysis with bacteria and WBCs, and abdomen pelvis without acute findings, Covid-19+.  She has made 500 cc of urine and been given 10 units insulin, Kayexalate, 1 amp of bicarb and PCCM was consulted for admission secondary to altered mental status and possible need for dialysis.  Past Medical History   has no past medical history on file.   Significant Hospital Events   1/9-admit to stepdown under PCCM  Consults:  Nephrology  Procedures:    Significant Diagnostic Tests:  1/9 CT head>>no acute intracranial pathology 1/9 CT abdomen pelvis>> 1 cm nonobstructing left renal inferior pole stone, no hydronephrosis, multifocal pneumonia 1/9  CXR>>Multifocal pneumonia, likely viral or atypical in etiology.  Micro Data:  1/9 urine culture>> 1/9 SARS-COV-2>>positive 1/9 influenza A and B>> negative Antimicrobials:  Ceftriaxone 1/9- Remdesivir1/9  Interim history/subjective:  Doing well, had HD this AM. Desats with activity. + cough, dry Still making urine Had some diarrhea this AM.  Objective   Blood pressure (!) 179/83, pulse 82, temperature 98.2 F (36.8 C), resp. rate 18, height 5\' 2"  (1.575 m), weight 77.8 kg, last menstrual period 10/30/2000, SpO2 (!) 88 %.        Intake/Output Summary (Last 24 hours) at 01/23/2019 1227 Last data filed at 01/23/2019 [id number] Gross per 24 hour  Intake --  Output 1000 ml  Net -1000 ml   Filed Weights   01/23/19 0747  Weight: 77.8 kg     GEN: elderly woman in NAD HEENT: MMM, no thrush CV: RRR, ext warm PULM: Scattered rhonci, no accessory muscle use GI: Soft, +BS EXT: No edema NEURO: Moves all 4 ext to command PSYCH: good insight SKIN: RIJ HD catheter CDI    Resolved Hospital Problem list     Assessment & Plan:  # Hypoxemia due to COVID pneumonia # Acute renal failure- NSAID + prerenal + COVID infection, nephrology following # HTN on amlodipine # Uremic encephalopathy improved  - Encourage IS and self proning - O2 titrated to sats > 85% or patient comfort - iHD per nephrology - Heparin 7500 units TID for DVT ppx - Continue remdesivir/dexamethasone for usual course - Check Pct, if < 0.5, stop all antibiotics - Appreciate TRH taking  over patient tomorrow  Erskine Emery MD PCCM

## 2019-01-23 NOTE — Progress Notes (Signed)
Pt back from dialysis she is alert and oriented

## 2019-01-23 NOTE — Progress Notes (Signed)
Pt received from ED alert and oriented some times confuse with the year. Oriented to the room and patient care equipments. Fall risk assessed and the need to call for help when needed has been explained to her per patient has not walking since she came to ED but prior to admission walks with no assisive device , has stage one in the crack prescribed meds given admission package also given, will continue to monitor

## 2019-01-23 NOTE — Progress Notes (Signed)
Patient with new onset of diarrhea, patient destat while on a trip to bathroom, placed on 4 litters patient  reocevered then moved down to 2L to maintained.

## 2019-01-23 NOTE — Plan of Care (Signed)
  Problem: Education: Goal: Knowledge of risk factors and measures for prevention of condition will improve Outcome: Progressing   Problem: Respiratory: Goal: Will maintain a patent airway Outcome: Progressing   

## 2019-01-23 NOTE — Progress Notes (Signed)
Report given to hemodialysis nurse pt picked up for dialysis

## 2019-01-23 NOTE — Progress Notes (Signed)
Patient moved to room air and sats are 94%

## 2019-01-24 DIAGNOSIS — R4182 Altered mental status, unspecified: Secondary | ICD-10-CM

## 2019-01-24 LAB — RENAL FUNCTION PANEL
Albumin: 1.3 g/dL — ABNORMAL LOW (ref 3.5–5.0)
Anion gap: 12 (ref 5–15)
BUN: 124 mg/dL — ABNORMAL HIGH (ref 8–23)
CO2: 23 mmol/L (ref 22–32)
Calcium: 8 mg/dL — ABNORMAL LOW (ref 8.9–10.3)
Chloride: 101 mmol/L (ref 98–111)
Creatinine, Ser: 9.37 mg/dL — ABNORMAL HIGH (ref 0.44–1.00)
GFR calc Af Amer: 5 mL/min — ABNORMAL LOW (ref >60)
GFR calc non Af Amer: 4 mL/min — ABNORMAL LOW (ref >60)
Glucose, Bld: 118 mg/dL — ABNORMAL HIGH (ref 70–99)
Phosphorus: 11.8 mg/dL — ABNORMAL HIGH (ref 2.5–4.6)
Potassium: 4.3 mmol/L (ref 3.5–5.1)
Sodium: 136 mmol/L (ref 135–145)

## 2019-01-24 LAB — CBC WITH DIFFERENTIAL/PLATELET
Abs Immature Granulocytes: 0.21 10*3/uL — ABNORMAL HIGH (ref 0.00–0.07)
Basophils Absolute: 0 10*3/uL (ref 0.0–0.1)
Basophils Relative: 0 %
Eosinophils Absolute: 0 10*3/uL (ref 0.0–0.5)
Eosinophils Relative: 0 %
HCT: 22.2 % — ABNORMAL LOW (ref 36.0–46.0)
Hemoglobin: 7.6 g/dL — ABNORMAL LOW (ref 12.0–15.0)
Immature Granulocytes: 2 %
Lymphocytes Relative: 8 %
Lymphs Abs: 0.8 10*3/uL (ref 0.7–4.0)
MCH: 28 pg (ref 26.0–34.0)
MCHC: 34.2 g/dL (ref 30.0–36.0)
MCV: 81.9 fL (ref 80.0–100.0)
Monocytes Absolute: 0.9 10*3/uL (ref 0.1–1.0)
Monocytes Relative: 10 %
Neutro Abs: 7.8 10*3/uL — ABNORMAL HIGH (ref 1.7–7.7)
Neutrophils Relative %: 80 %
Platelets: 313 10*3/uL (ref 150–400)
RBC: 2.71 MIL/uL — ABNORMAL LOW (ref 3.87–5.11)
RDW: 14.6 % (ref 11.5–15.5)
WBC: 9.7 10*3/uL (ref 4.0–10.5)
nRBC: 0 % (ref 0.0–0.2)

## 2019-01-24 LAB — IRON AND TIBC
Iron: 88 ug/dL (ref 28–170)
Saturation Ratios: 54 % — ABNORMAL HIGH (ref 10.4–31.8)
TIBC: 162 ug/dL — ABNORMAL LOW (ref 250–450)
UIBC: 74 ug/dL

## 2019-01-24 LAB — RETICULOCYTES
Immature Retic Fract: 15.6 % (ref 2.3–15.9)
RBC.: 3.09 MIL/uL — ABNORMAL LOW (ref 3.87–5.11)
Retic Count, Absolute: 27.2 10*3/uL (ref 19.0–186.0)
Retic Ct Pct: 0.9 % (ref 0.4–3.1)

## 2019-01-24 LAB — FOLATE: Folate: 9 ng/mL (ref >5.9)

## 2019-01-24 LAB — VITAMIN B12: Vitamin B-12: 571 pg/mL (ref 180–914)

## 2019-01-24 MED ORDER — PANTOPRAZOLE SODIUM 40 MG PO TBEC
40.0000 mg | DELAYED_RELEASE_TABLET | Freq: Every day | ORAL | Status: DC
  Administered 2019-01-24 – 2019-02-05 (×12): 40 mg via ORAL
  Filled 2019-01-24 (×12): qty 1

## 2019-01-24 MED ORDER — CHLORHEXIDINE GLUCONATE CLOTH 2 % EX PADS
6.0000 | MEDICATED_PAD | Freq: Every day | CUTANEOUS | Status: DC
  Administered 2019-01-25 – 2019-01-30 (×6): 6 via TOPICAL

## 2019-01-24 NOTE — Progress Notes (Addendum)
[facility] Progress Note   Subjective:    Tracy Escobar is a 64 yo F w/ PMh of HTN admit for COVID pneumonia on hospital day 3.   Tracy Escobar was examined and evaluated at bedside this am. She was observed resting comfortably in bed. She mentions that her respiratory distress has improved and she is feeling Escobar overall. She mentions that after being given furosemide, she did urinate but is unable to determine amount.  Objective Vitals:   01/23/19 1256 01/23/19 2000 01/23/19 2140 01/24/19 0535  BP: (!) 167/89 (!) 157/80 (!) 158/74 (!) 166/77  Pulse: 73 67 62 60  Resp: 16  17 16   Temp: 97.9 F (36.6 C)  98.6 F (37 C) 98.4 F (36.9 C)  TempSrc: Oral  Oral Oral  SpO2: 96% 92% 96% 90%  Weight:    79 kg  Height:       Physical Exam  Gen: Well-developed, well nourished, NAD Neck: supple, ROM intact, no JVD Pulm: Distant breath sounds, No rales, no wheezes,  Abd: Soft, BS+, NTND, No rebound, no guarding Extm: ROM intact, Peripheral pulses intact, Trace ankle edema Skin: Dry, Warm, normal turgor, no rashes, lesions, wounds.  Neuro: AAOx3, Cranial Nerve II-XII intact Psych: Normal mood and affect  Dialysis access: Right IJ dialysis cath placed 1/10 non tunneled    Additional Objective Labs: Basic Metabolic Panel: Recent Labs  Lab 01/22/19 1220 01/23/19 0614 01/24/19 0419  NA 139 137 136  K 4.0 3.8 4.3  CL 103 100 101  CO2 21* 22 23  GLUCOSE 214* 138* 118*  BUN 144* 106* 124*  CREATININE 9.52* 7.88* 9.37*  CALCIUM 8.0* 7.7* 8.0*  PHOS 10.7* 8.6* 11.8*   Liver Function Tests: Recent Labs  Lab 01/20/19 2250 01/22/19 1220 01/23/19 0614 01/24/19 0419  AST 16  --   --   --   ALT 10  --   --   --   ALKPHOS 42  --   --   --   BILITOT 0.5  --   --   --   PROT 6.4*  --   --   --   ALBUMIN 1.6* 1.3* 1.4* 1.3*   No results for input(s): LIPASE, AMYLASE in the last 168 hours. CBC: Recent Labs  Lab 01/20/19 2237 01/21/19 0122 01/21/19 0547 01/22/19 2110  01/24/19 0419  WBC 15.8* 18.0* 12.8* 12.7* 9.7  NEUTROABS  --   --   --   --  7.8*  HGB 10.7* 10.0* 9.2* 10.0* 7.6*  HCT 32.0* 29.6* 27.1* 29.1* 22.2*  MCV 84.9 84.6 82.9 79.1* 81.9  PLT 367 600* 341 418* 313   Blood Culture    Component Value Date/Time   SDES BLOOD RIGHT ANTECUBITAL 01/21/2019 0147   SPECREQUEST  01/21/2019 0147    BOTTLES DRAWN AEROBIC AND ANAEROBIC Blood Culture results may not be optimal due to an inadequate volume of blood received in culture bottles   CULT  01/21/2019 0147    NO GROWTH 2 DAYS Performed at [facility], [place] [address], [place],  [address]    REPTSTATUS PENDING 01/21/2019 0147    Cardiac Enzymes: Recent Labs  Lab 01/21/19 0547  CKTOTAL 65   CBG: Recent Labs  Lab 01/20/19 2344 01/21/19 0151  GLUCAP 117* 137*   Iron Studies:  No results for input(s): IRON, TIBC, TRANSFERRIN, FERRITIN in the last 72 hours. Studies/Results: No results found. Medications: . sodium chloride    . sodium chloride    .  azithromycin 500 mg (01/24/19 0807)  . cefTRIAXone (ROCEPHIN)  IV 1 g (01/24/19 0724)  . remdesivir 100 mg in NS 100 mL 100 mg (01/23/19 1009)   . amLODipine  5 mg Oral Daily  . Chlorhexidine Gluconate Cloth  6 each Topical Q0600  . dexamethasone (DECADRON) injection  6 mg Intravenous Q24H  . heparin  7,500 Units Subcutaneous Q8H  . influenza vac split quadrivalent PF  0.5 mL Intramuscular Tomorrow-1000    Dialysis Orders: N/A  Assessment/Plan: 1. Acute Kidney Injury: On admission. BUN 147, Creatinine 9 with acidosis and hyperkalemia. Unknown baseline HD 01/22/19 (2hrs, 500cc out) w/ resolution of hyperkalemia and acidosis. Am labs: K 4.3 Co2 23, BUN 124, Creatinine 9.37, Phos 11.8. Worsening urine output. Did not respond to diuretic. Too soon to tell if she will show renal recovery. Likely will need another episode of HD later today or tomorrow - Trend renal fx - Will need HD likely tomorrow per schedule allows-  Will  plan on tomorrow- try for first shift  - Avoid nephrotoxic meds - Strict I&Os, Daily Weights  2. COVID-19 infection: Improving respiratory status. Afebrile overnight. On remdesivir, decardron, azithromycin and ceftriaxone - Per primary  3. Anemia: Acute drop in hemoglobin from 10 to 7.6. No obvious source of bleed. Could in part be due to dilutional in setting of anuria but concerning for bleed - Monitor - F/u iron panel  4 HTN: Am bp 152/82 on Amlodipine - Should improve with diuresis  5. Nutrition: C/w renal diet w/ fluid restriction  Tracy Better, MD PGY-2 [facility] Pager: [phone]  Attending attestation to follow  Patient seen and examined, agree with above note with above modifications. Unfortunately did not respond to the lasix challenge and has not shown organic recovery of renal function.  We have no baseline renal function so possibly this is just her presentation of ESRD.  Will plan on HD tomorrow due to high pt volume today but cont to watch for any signs of recovery - phos should come down with HD  Corliss Parish, MD 01/24/2019

## 2019-01-24 NOTE — Progress Notes (Signed)
Pt's daughter [name] updated on patients condition and plan of care. All questions answered.

## 2019-01-24 NOTE — Progress Notes (Addendum)
PROGRESS NOTE    Tracy Escobar  [id number] DOB: 09-Dec-1955 DOA: 01/20/2019 PCP: Patient, No Pcp Per  Brief Narrative: PCCM transfer to [facility] 1/12, Tracy Escobar is a 63/F with no significant past medical history was brought to the emergency room on 1/9 with encephalopathy, decreased p.o. intake for a few days. -The emergency room she was noted to have COVID-19 infection with pneumonia and severe acute kidney injury with a creatinine of 8/BUN of 140 from a baseline of 1.2. -She was admitted to the ICU, underwent temporary HD catheter placement and dialysis on 1/10 -Also treated with IV remdesivir and Decadron for pneumonia -Nephrology following -Transferred to [facility] service today  Assessment & Plan:   AKI/ATN -Admitted with creatinine of 8.6 and BUN of 148, felt to be secondary to Covid, and prerenal component as well as NSAID use(naproxen on med list) -No recent labs in our system, baseline unknown, last creatinine  from 2009 was 1.2 -CT without hydronephrosis -Nephrology consulting, underwent temporary HD catheter placement on 1/9 and dialysis on 1/10 -Continue Foley catheter, strict I's/O's -Urine output of 900 mL recorded for previous 24 hours but only 50 cc last night -per Renal -Monitor labs, urine output  COVID-19 pneumonia -Imaging on admission consistent with COVID-19 pneumonia -Day 4 of IV remdesivir and Decadron -Was also getting CAP coverage in ICU, will complete this after 5 days tomorrow  Acute metabolic encephalopathy -Secondary to uremia, COVID-19 pneumonia -Resolved, AAO x3 now  Normocytic anemia -Hemoglobin has gradually trended down from 10 > 9 >7.6 this morning -Patient denies any overt bleeding/melena or hematochezia, none noted by staff as well -Check anemia panel, trend and monitor -I will continue DVT prophylaxis for now unless hemoglobin trends down further or overt bleeding noted  Hyperkalemia -Resolved with HD, renal diet  Hyperglycemia -Check  hemoglobin A1c  DVT prophylaxis: Heparin subcutaneous Code Status: Full code Family Communication: No family at bedside, will attempt to contact family later today Disposition Plan: Depends on renal recovery  Consultants:  Nephrology PCCM   Procedures:   Antimicrobials:    Subjective: -Patient denies any complaints this morning, denies shortness of breath, mild cough reported -Feels like her mind is clearer, no nausea vomiting  Objective: Vitals:   01/23/19 2000 01/23/19 2140 01/24/19 0535 01/24/19 0807  BP: (!) 157/80 (!) 158/74 (!) 166/77 (!) 153/75  Pulse: 67 62 60 61  Resp:  17 16   Temp:  98.6 F (37 C) 98.4 F (36.9 C)   TempSrc:  Oral Oral   SpO2: 92% 96% 90% 91%  Weight:   79 kg   Height:        Intake/Output Summary (Last 24 hours) at 01/24/2019 1123 Last data filed at 01/24/2019 1028 Gross per 24 hour  Intake 567 ml  Output 51 ml  Net 516 ml   Filed Weights   01/23/19 0747 01/24/19 0535  Weight: 77.8 kg 79 kg    Examination:  General exam: Awake alert, oriented x3, no distress HEENT: Right IJ temporary dialysis catheter noted Respiratory system: Poor air movement bilaterally, otherwise clear Cardiovascular system: S1 & S2 heard, RRR Gastrointestinal system: Abdomen is nondistended, soft and nontender.Normal bowel sounds heard. Central nervous system: Alert and oriented.,  Moves all extremities, no localizing signs extremities: No edema Skin: No rashes, lesions or ulcers Psychiatry: Appropriate mood & affect.     Data Reviewed:   CBC: Recent Labs  Lab 01/20/19 2237 01/21/19 0104 01/21/19 0122 01/21/19 0547 01/22/19 2110 01/24/19 0419  WBC 15.8*  --  18.0* 12.8* 12.7* 9.7  NEUTROABS  --   --   --   --   --  7.8*  HGB 10.7* 9.9* 10.0* 9.2* 10.0* 7.6*  HCT 32.0* 29.0* 29.6* 27.1* 29.1* 22.2*  MCV 84.9  --  84.6 82.9 79.1* 81.9  PLT 367  --  600* 341 418* [id number]   Basic Metabolic Panel: Recent Labs  Lab 01/21/19 0547 01/21/19 1607  01/22/19 1220 01/23/19 0614 01/24/19 0419  NA 142 142 139 137 136  K 4.9 4.1 4.0 3.8 4.3  CL 113* 109 103 100 101  CO2 14* 17* 21* 22 23  GLUCOSE 126* 189* 214* 138* 118*  BUN 147* 142* 144* 106* 124*  CREATININE 8.86*  9.04* 9.14* 9.52* 7.88* 9.37*  CALCIUM 8.0* 7.8* 8.0* 7.7* 8.0*  PHOS  --   --  10.7* 8.6* 11.8*   GFR: Estimated Creatinine Clearance: 6.1 mL/min (A) (by C-G formula based on SCr of 9.37 mg/dL (H)). Liver Function Tests: Recent Labs  Lab 01/20/19 2250 01/22/19 1220 01/23/19 0614 01/24/19 0419  AST 16  --   --   --   ALT 10  --   --   --   ALKPHOS 42  --   --   --   BILITOT 0.5  --   --   --   PROT 6.4*  --   --   --   ALBUMIN 1.6* 1.3* 1.4* 1.3*   No results for input(s): LIPASE, AMYLASE in the last 168 hours. No results for input(s): AMMONIA in the last 168 hours. Coagulation Profile: No results for input(s): INR, PROTIME in the last 168 hours. Cardiac Enzymes: Recent Labs  Lab 01/21/19 0547  CKTOTAL 65   BNP (last 3 results) No results for input(s): PROBNP in the last 8760 hours. HbA1C: No results for input(s): HGBA1C in the last 72 hours. CBG: Recent Labs  Lab 01/20/19 2344 01/21/19 0151  GLUCAP 117* 137*   Lipid Profile: No results for input(s): CHOL, HDL, LDLCALC, TRIG, CHOLHDL, LDLDIRECT in the last 72 hours. Thyroid Function Tests: No results for input(s): TSH, T4TOTAL, FREET4, T3FREE, THYROIDAB in the last 72 hours. Anemia Panel: No results for input(s): VITAMINB12, FOLATE, FERRITIN, TIBC, IRON, RETICCTPCT in the last 72 hours. Urine analysis:    Component Value Date/Time   COLORURINE AMBER (A) 01/21/2019 0047   APPEARANCEUR CLOUDY (A) 01/21/2019 0047   LABSPEC 1.034 (H) 01/21/2019 0047   PHURINE 5.0 01/21/2019 0047   GLUCOSEU NEGATIVE 01/21/2019 0047   HGBUR NEGATIVE 01/21/2019 0047   [place] 01/21/2019 [place] 01/21/2019 0047   PROTEINUR 100 (A) 01/21/2019 0047   NITRITE NEGATIVE 01/21/2019  0047   LEUKOCYTESUR TRACE (A) 01/21/2019 0047   Sepsis Labs: @LABRCNTIP (procalcitonin:4,lacticidven:4)  ) Recent Results (from the past 240 hour(s))  Urine culture     Status: Abnormal   Collection Time: 01/21/19 12:48 AM   Specimen: Urine, Catheterized  Result Value Ref Range Status   Specimen Description URINE, CATHETERIZED  Final   Special Requests   Final    NONE Performed at [facility], [address], [place], [place] [address]    Culture MULTIPLE SPECIES PRESENT, SUGGEST RECOLLECTION (A)  Final   Report Status 01/22/2019 FINAL  Final  Respiratory Panel by RT PCR (Flu A&B, Covid) - Nasopharyngeal Swab     Status: Abnormal   Collection Time: 01/21/19  1:00 AM   Specimen: Nasopharyngeal Swab  Result Value Ref Range Status   SARS  Coronavirus 2 by RT PCR POSITIVE (A) NEGATIVE Final    Comment: RESULT CALLED TO, READ BACK BY AND VERIFIED WITH: L VENEGAS RN 01/21/19 0225 JDW (NOTE) SARS-CoV-2 target nucleic acids are DETECTED. SARS-CoV-2 RNA is generally detectable in upper respiratory specimens  during the acute phase of infection. Positive results are indicative of the presence of the identified virus, but do not rule out bacterial infection or co-infection with other pathogens not detected by the test. Clinical correlation with patient history and other diagnostic information is necessary to determine patient infection status. The expected result is Negative. Fact Sheet for Patients:  PinkCheek.be Fact Sheet for Healthcare Providers: GravelBags.it This test is not yet approved or cleared by the [place] FDA and  has been authorized for detection and/or diagnosis of SARS-CoV-2 by FDA under an Emergency Use Authorization (EUA).  This EUA will remain in effect (meaning this test can be used) for the  duration of  the COVID-19 declaration under Section 564(b)(1) of the Act, 21 U.S.C. section 360bbb-3(b)(1),  unless the authorization is terminated or revoked sooner.    Influenza A by PCR NEGATIVE NEGATIVE Final   Influenza B by PCR NEGATIVE NEGATIVE Final    Comment: (NOTE) The Xpert Xpress SARS-CoV-2/FLU/RSV assay is intended as an aid in  the diagnosis of influenza from Nasopharyngeal swab specimens and  should not be used as a sole basis for treatment. Nasal washings and  aspirates are unacceptable for Xpert Xpress SARS-CoV-2/FLU/RSV  testing. Fact Sheet for Patients: PinkCheek.be Fact Sheet for Healthcare Providers: GravelBags.it This test is not yet approved or cleared by the [place] FDA and  has been authorized for detection and/or diagnosis of SARS-CoV-2 by  FDA under an Emergency Use Authorization (EUA). This EUA will remain  in effect (meaning this test can be used) for the duration of the  Covid-19 declaration under Section 564(b)(1) of the Act, 21  U.S.C. section 360bbb-3(b)(1), unless the authorization is  terminated or revoked. Performed at [facility], [place] [address], [place], [place] [address]   Culture, blood (routine x 2)     Status: None (Preliminary result)   Collection Time: 01/21/19  1:47 AM   Specimen: BLOOD  Result Value Ref Range Status   Specimen Description BLOOD RIGHT ANTECUBITAL  Final   Special Requests   Final    BOTTLES DRAWN AEROBIC AND ANAEROBIC Blood Culture results may not be optimal due to an inadequate volume of blood received in culture bottles   Culture   Final    NO GROWTH 2 DAYS Performed at [facility], [place] [address] [place], [place] [address]    Report Status PENDING  Incomplete         Radiology Studies: No results found.      Scheduled Meds: . amLODipine  5 mg Oral Daily  . Chlorhexidine Gluconate Cloth  6 each Topical Q0600  . dexamethasone (DECADRON) injection  6 mg Intravenous Q24H  . heparin  7,500 Units Subcutaneous Q8H  . influenza vac  split quadrivalent PF  0.5 mL Intramuscular Tomorrow-1000   Continuous Infusions: . sodium chloride    . sodium chloride    . azithromycin 500 mg (01/24/19 0807)  . cefTRIAXone (ROCEPHIN)  IV 1 g (01/24/19 0724)  . remdesivir 100 mg in NS 100 mL 100 mg (01/24/19 1005)     LOS: 3 days    Time spent: 48min    Domenic Polite, MD [facility]  01/24/2019, 11:23 AM

## 2019-01-25 LAB — COMPREHENSIVE METABOLIC PANEL
ALT: 15 U/L (ref 0–44)
AST: 20 U/L (ref 15–41)
Albumin: 1.4 g/dL — ABNORMAL LOW (ref 3.5–5.0)
Alkaline Phosphatase: 29 U/L — ABNORMAL LOW (ref 38–126)
Anion gap: 17 — ABNORMAL HIGH (ref 5–15)
BUN: 143 mg/dL — ABNORMAL HIGH (ref 8–23)
CO2: 19 mmol/L — ABNORMAL LOW (ref 22–32)
Calcium: 7.8 mg/dL — ABNORMAL LOW (ref 8.9–10.3)
Chloride: 99 mmol/L (ref 98–111)
Creatinine, Ser: 11.24 mg/dL — ABNORMAL HIGH (ref 0.44–1.00)
GFR calc Af Amer: 4 mL/min — ABNORMAL LOW (ref >60)
GFR calc non Af Amer: 3 mL/min — ABNORMAL LOW (ref >60)
Glucose, Bld: 146 mg/dL — ABNORMAL HIGH (ref 70–99)
Potassium: 4.2 mmol/L (ref 3.5–5.1)
Sodium: 135 mmol/L (ref 135–145)
Total Bilirubin: 0.6 mg/dL (ref 0.3–1.2)
Total Protein: 4.7 g/dL — ABNORMAL LOW (ref 6.5–8.1)

## 2019-01-25 LAB — CBC
HCT: 21.4 % — ABNORMAL LOW (ref 36.0–46.0)
Hemoglobin: 7.2 g/dL — ABNORMAL LOW (ref 12.0–15.0)
MCH: 27.4 pg (ref 26.0–34.0)
MCHC: 33.6 g/dL (ref 30.0–36.0)
MCV: 81.4 fL (ref 80.0–100.0)
Platelets: 304 10*3/uL (ref 150–400)
RBC: 2.63 MIL/uL — ABNORMAL LOW (ref 3.87–5.11)
RDW: 14.5 % (ref 11.5–15.5)
WBC: 10.2 10*3/uL (ref 4.0–10.5)
nRBC: 0 % (ref 0.0–0.2)

## 2019-01-25 LAB — FERRITIN: Ferritin: 349 ng/mL — ABNORMAL HIGH (ref 11–307)

## 2019-01-25 LAB — PHOSPHORUS: Phosphorus: >30 mg/dL — ABNORMAL HIGH (ref 2.5–4.6)

## 2019-01-25 LAB — D-DIMER, QUANTITATIVE: D-Dimer, Quant: 3.55 ug/mL-FEU — ABNORMAL HIGH (ref 0.00–0.50)

## 2019-01-25 LAB — HEMOGLOBIN A1C
Hgb A1c MFr Bld: 6.7 % — ABNORMAL HIGH (ref 4.8–5.6)
Mean Plasma Glucose: 145.59 mg/dL

## 2019-01-25 LAB — C-REACTIVE PROTEIN: CRP: 4.8 mg/dL — ABNORMAL HIGH (ref <1.0)

## 2019-01-25 MED ORDER — HEPARIN SODIUM (PORCINE) 5000 UNIT/ML IJ SOLN
5000.0000 [IU] | Freq: Three times a day (TID) | INTRAMUSCULAR | Status: DC
  Administered 2019-01-25 – 2019-01-27 (×5): 5000 [IU] via SUBCUTANEOUS
  Filled 2019-01-25 (×5): qty 1

## 2019-01-25 MED ORDER — DEXAMETHASONE 6 MG PO TABS
6.0000 mg | ORAL_TABLET | Freq: Every day | ORAL | Status: DC
  Administered 2019-01-25 – 2019-01-31 (×7): 6 mg via ORAL
  Filled 2019-01-25 (×7): qty 1

## 2019-01-25 MED ORDER — DARBEPOETIN ALFA 100 MCG/0.5ML IJ SOSY
100.0000 ug | PREFILLED_SYRINGE | INTRAMUSCULAR | Status: DC
  Administered 2019-02-01: 100 ug via SUBCUTANEOUS
  Filled 2019-01-25 (×3): qty 0.5

## 2019-01-25 NOTE — Progress Notes (Signed)
Tracy Escobar   Subjective:    Tracy Escobar is a 64 yo F w/ PMh of HTN admit for COVID pneumonia on hospital day 3.   Required HD today due to worsening uremia-  Was uncomfortable and wanted to sign off but didn't-  I saw her after HD-  She actually had a good amount of urine in her foley so did not pull volume   Objective Vitals:   01/24/19 1436 01/24/19 2000 01/25/19 0345 01/25/19 0515  BP: (!) 143/73 (!) 145/76 137/71   Pulse: 78 60 62   Resp: 18 (!) 22 20   Temp: 97.6 F (36.4 C)  98.5 F (36.9 C) 98.7 F (37.1 C)  TempSrc: Oral  Oral Oral  SpO2: 92% 94% 94%   Weight:      Height:       Physical Exam  Gen: Well-developed, well nourished, NAD Neck: supple, ROM intact, no JVD Pulm: Distant breath sounds, No rales, no wheezes,  Abd: Soft, BS+, NTND, No rebound, no guarding Extm: ROM intact, Peripheral pulses intact, Trace ankle edema Skin: Dry, Warm, normal turgor, no rashes, lesions, wounds.  Neuro: AAOx3, Cranial Nerve II-XII intact Psych: Normal mood and affect  Dialysis access: Right IJ dialysis cath placed 1/10 non tunneled    Additional Objective Labs: Basic Metabolic Panel: Recent Labs  Lab 01/22/19 1220 01/23/19 0614 01/24/19 0419 01/25/19 0351  NA 139 137 136 135  K 4.0 3.8 4.3 4.2  CL 103 100 101 99  CO2 21* 22 23 19*  GLUCOSE 214* 138* 118* 146*  BUN 144* 106* 124* 143*  CREATININE 9.52* 7.88* 9.37* 11.24*  CALCIUM 8.0* 7.7* 8.0* 7.8*  PHOS 10.7* 8.6* 11.8*  --    Liver Function Tests: Recent Labs  Lab 01/20/19 2250 01/23/19 0614 01/24/19 0419 01/25/19 0351  AST 16  --   --  20  ALT 10  --   --  15  ALKPHOS 42  --   --  29*  BILITOT 0.5  --   --  0.6  PROT 6.4*  --   --  4.7*  ALBUMIN 1.6* 1.4* 1.3* 1.4*   No results for input(s): LIPASE, AMYLASE in the last 168 hours. CBC: Recent Labs  Lab 01/21/19 0122 01/21/19 0547 01/22/19 2110 01/24/19 0419 01/25/19 0351  WBC 18.0* 12.8* 12.7* 9.7 10.2  NEUTROABS   --   --   --  7.8*  --   HGB 10.0* 9.2* 10.0* 7.6* 7.2*  HCT 29.6* 27.1* 29.1* 22.2* 21.4*  MCV 84.6 82.9 79.1* 81.9 81.4  PLT 600* 341 418* 313 304   Blood Culture    Component Value Date/Time   SDES BLOOD RIGHT ANTECUBITAL 01/21/2019 0147   SPECREQUEST  01/21/2019 0147    BOTTLES DRAWN AEROBIC AND ANAEROBIC Blood Culture results may not be optimal due to an inadequate volume of blood received in culture bottles   CULT  01/21/2019 0147    NO GROWTH 3 DAYS Performed at [facility], [place] [address] [place], [place] [address]    REPTSTATUS PENDING 01/21/2019 0147    Cardiac Enzymes: Recent Labs  Lab 01/21/19 0547  CKTOTAL 65   CBG: Recent Labs  Lab 01/20/19 2344 01/21/19 0151  GLUCAP 117* 137*   Iron Studies:  Recent Labs    01/24/19 1450 01/25/19 0351  IRON 88  --   TIBC 162*  --   FERRITIN  --  349*   Studies/Results: No results found. Medications: .  sodium chloride    . sodium chloride    . azithromycin 500 mg (01/24/19 0807)  . remdesivir 100 mg in NS 100 mL 100 mg (01/24/19 1005)   . amLODipine  5 mg Oral Daily  . Chlorhexidine Gluconate Cloth  6 each Topical Q0600  . Chlorhexidine Gluconate Cloth  6 each Topical Q0600  . dexamethasone (DECADRON) injection  6 mg Intravenous Q24H  . heparin  5,000 Units Subcutaneous Q8H  . influenza vac split quadrivalent PF  0.5 mL Intramuscular Tomorrow-1000  . pantoprazole  40 mg Oral Q1200    Dialysis Orders: N/A  Assessment/Plan: 1. Acute Kidney Injury: On admission. BUN 147, Creatinine 9 with acidosis and hyperkalemia. Unknown baseline HD 01/22/19 (2hrs, 500cc out) w/ improvement of hyperkalemia and acidosis. Worsening urine output. Did not respond to diuretic. Too soon to tell if she will show renal recovery. Repeated HD today for clearance - Trend renal fx - cont to follow and do HD PRN - Avoid nephrotoxic meds - Strict I&Os, Daily Weights She does not appear to be uremic   2. COVID-19 infection:  Improving respiratory status. Afebrile overnight. On remdesivir, decadron, azithromycin and ceftriaxone - Per primary  3. Anemia: Acute drop in hemoglobin from 10 to 7.6. No obvious source of bleed. Could in part be due to dilutional in setting of anuria but concerning for bleed - Monitor -  iron panel shows sat of 54-  No iron need for now- will give ESA   4 HTN: Am bp 152/82 on Amlodipine 5 - Should improve with diuresis BP still high but also some anxiety component-  Prefer to keep BP med alone for now  5. Nutrition: C/w renal diet w/ fluid restriction  Tracy Better, MD PGY-2 [facility] Pager: [phone]  Attending attestation to follow  Patient seen and examined, agree with above Escobar with above modifications. Very anxious-  Wanted to come off of HD but stayed on.  Now with more UOP.  Really not sure if this is acute or chronic.  Cont to follow and do HD PRN via temp HD cath placed on 1/9 -  Started ESA for anemia   Corliss Parish, MD 01/25/2019

## 2019-01-25 NOTE — Plan of Care (Signed)
  Problem: Education: Goal: Knowledge of risk factors and measures for prevention of condition will improve Outcome: Progressing   Problem: Coping: Goal: Psychosocial and spiritual needs will be supported Outcome: Progressing   Problem: Respiratory: Goal: Will maintain a patent airway Outcome: Progressing Goal: Complications related to the disease process, condition or treatment will be avoided or minimized Outcome: Progressing   

## 2019-01-25 NOTE — Progress Notes (Signed)
Sent to dilysis after changing RAC PIV, pt let entire bag of azithromycin infuse before she said that the IV was apinful, educated pt to notify staff if she had pai or disconfort so it could be addressed

## 2019-01-25 NOTE — Progress Notes (Signed)
PROGRESS NOTE    Noon Sipp  [id number] DOB: Sep 20, 1955 DOA: 01/20/2019 PCP: Patient, No Pcp Per  Brief Narrative: PCCM transfer to [facility] 1/12, [name] is a 63/F with no significant past medical history was brought to the emergency room on 1/9 with encephalopathy, decreased p.o. intake for a few days. -The emergency room she was noted to have COVID-19 infection with pneumonia and severe acute kidney injury with a creatinine of 8/BUN of 140 from a baseline of 1.2. -She was admitted to the ICU, underwent temporary HD catheter placement and dialysis on 1/10 -Also treated with IV remdesivir and Decadron for pneumonia -Nephrology following -Transferred to [facility] service 1/12 -1/13, poor urine output, worsening kidney function, anemia, plan for HD again  Assessment & Plan:   AKI/ATN -Admitted with creatinine of 8.6 and BUN of 148, felt to be secondary to Covid, and prerenal component as well as NSAID use(naproxen on med list) -No recent labs in our system, baseline unknown, last creatinine  from 2009 was 1.2, she could have had progressive CKD at baseline -CT without hydronephrosis -Nephrology consulting, underwent temporary HD catheter placement on 1/9 and dialysis on 1/10, Continue Foley catheter -Very poor urine output, only 75 cc last 24 hours, creatinine and BUN is significantly worse, nephrology following, plan for HD again today -Renal diet, watch for renal recovery, looks less promising at this point  COVID-19 pneumonia -Imaging on admission consistent with COVID-19 pneumonia -Day 5 of IV remdesivir and Decadron -Was also getting CAP coverage in ICU, stop IV remdesivir today  Acute metabolic encephalopathy -Secondary to uremia, COVID-19 pneumonia -Resolved, AAO x3 now  Normocytic anemia -Hemoglobin has gradually trended down from 10 > 9 >7.6 this morning -Patient denies any overt bleeding/melena or hematochezia, none noted by staff as well -Anemia panel suggestive of  chronic disease -I suspect this is a dilutional component given very poor urine output over the last 48 hours, monitor with dialysis -Continue DVT prophylaxis for now unless active bleeding noted -Continue EPO with HD  Hyperkalemia -Resolved with HD, renal diet  Hyperglycemia -Check hemoglobin A1c  DVT prophylaxis: Heparin subcutaneous Code Status: Full code Family Communication: No family at bedside, called and updated daughter Bary Richard Disposition Plan: Depends on renal recovery  Consultants:  Nephrology PCCM   Procedures:   Antimicrobials:    Subjective: -Had very poor urine output yesterday, 75 cc recorded -Patient denies any nausea vomiting, no shortness of breath no swelling  Objective: Vitals:   01/25/19 1230 01/25/19 1240 01/25/19 1300 01/25/19 1330  BP: (!) 160/87 (!) 170/90 (!) 172/86 (!) 156/80  Pulse: 75 75 75   Resp: 20 20 20    Temp:  98.1 F (36.7 C)    TempSrc:  Axillary    SpO2: 95%     Weight: 79 kg     Height:        Intake/Output Summary (Last 24 hours) at 01/25/2019 1408 Last data filed at 01/25/2019 [id number] Gross per 24 hour  Intake 750 ml  Output --  Net 750 ml   Filed Weights   01/23/19 0747 01/24/19 0535 01/25/19 1230  Weight: 77.8 kg 79 kg 79 kg    Examination:  General exam: Awake alert, oriented x3, no distress HEENT: Right IJ temporary dialysis catheter noted Respiratory system: Poor air movement bilaterally, otherwise clear Cardiovascular system: S1 & S2 heard, RRR Gastrointestinal system: Abdomen is nondistended, soft and nontender.Normal bowel sounds heard. Central nervous system: Alert and oriented.,  Moves all extremities, no localizing signs extremities:  No edema Skin: No rashes, lesions or ulcers Psychiatry: Appropriate mood & affect.     Data Reviewed:   CBC: Recent Labs  Lab 01/21/19 0122 01/21/19 0547 01/22/19 2110 01/24/19 0419 01/25/19 0351  WBC 18.0* 12.8* 12.7* 9.7 10.2  NEUTROABS  --   --   --   7.8*  --   HGB 10.0* 9.2* 10.0* 7.6* 7.2*  HCT 29.6* 27.1* 29.1* 22.2* 21.4*  MCV 84.6 82.9 79.1* 81.9 81.4  PLT 600* 341 418* 313 [id number]   Basic Metabolic Panel: Recent Labs  Lab 01/21/19 1607 01/22/19 1220 01/23/19 0614 01/24/19 0419 01/25/19 0351 01/25/19 1111  NA 142 139 137 136 135  --   K 4.1 4.0 3.8 4.3 4.2  --   CL 109 103 100 101 99  --   CO2 17* 21* 22 23 19*  --   GLUCOSE 189* 214* 138* 118* 146*  --   BUN 142* 144* 106* 124* 143*  --   CREATININE 9.14* 9.52* 7.88* 9.37* 11.24*  --   CALCIUM 7.8* 8.0* 7.7* 8.0* 7.8*  --   PHOS  --  10.7* 8.6* 11.8*  --  >30.0*   GFR: Estimated Creatinine Clearance: 5.1 mL/min (A) (by C-G formula based on SCr of 11.24 mg/dL (H)). Liver Function Tests: Recent Labs  Lab 01/20/19 2250 01/22/19 1220 01/23/19 0614 01/24/19 0419 01/25/19 0351  AST 16  --   --   --  20  ALT 10  --   --   --  15  ALKPHOS 42  --   --   --  29*  BILITOT 0.5  --   --   --  0.6  PROT 6.4*  --   --   --  4.7*  ALBUMIN 1.6* 1.3* 1.4* 1.3* 1.4*   No results for input(s): LIPASE, AMYLASE in the last 168 hours. No results for input(s): AMMONIA in the last 168 hours. Coagulation Profile: No results for input(s): INR, PROTIME in the last 168 hours. Cardiac Enzymes: Recent Labs  Lab 01/21/19 0547  CKTOTAL 65   BNP (last 3 results) No results for input(s): PROBNP in the last 8760 hours. HbA1C: No results for input(s): HGBA1C in the last 72 hours. CBG: Recent Labs  Lab 01/20/19 2344 01/21/19 0151  GLUCAP 117* 137*   Lipid Profile: No results for input(s): CHOL, HDL, LDLCALC, TRIG, CHOLHDL, LDLDIRECT in the last 72 hours. Thyroid Function Tests: No results for input(s): TSH, T4TOTAL, FREET4, T3FREE, THYROIDAB in the last 72 hours. Anemia Panel: Recent Labs    01/24/19 1450 01/25/19 0351  VITAMINB12 571  --   FOLATE 9.0  --   FERRITIN  --  349*  TIBC 162*  --   IRON 88  --   RETICCTPCT 0.9  --    Urine analysis:    Component Value  Date/Time   COLORURINE AMBER (A) 01/21/2019 0047   APPEARANCEUR CLOUDY (A) 01/21/2019 0047   LABSPEC 1.034 (H) 01/21/2019 0047   PHURINE 5.0 01/21/2019 0047   GLUCOSEU NEGATIVE 01/21/2019 0047   HGBUR NEGATIVE 01/21/2019 0047   BILIRUBINUR NEGATIVE 01/21/2019 0047   [place] 01/21/2019 0047   PROTEINUR 100 (A) 01/21/2019 0047   NITRITE NEGATIVE 01/21/2019 0047   LEUKOCYTESUR TRACE (A) 01/21/2019 0047   Sepsis Labs: @LABRCNTIP (procalcitonin:4,lacticidven:4)  ) Recent Results (from the past 240 hour(s))  Urine culture     Status: Abnormal   Collection Time: 01/21/19 12:48 AM   Specimen: Urine, Catheterized  Result Value Ref Range  Status   Specimen Description URINE, CATHETERIZED  Final   Special Requests   Final    NONE Performed at [facility], [place] [address], [place], [place] [address]    Culture MULTIPLE SPECIES PRESENT, SUGGEST RECOLLECTION (A)  Final   Report Status 01/22/2019 FINAL  Final  Respiratory Panel by RT PCR (Flu A&B, Covid) - Nasopharyngeal Swab     Status: Abnormal   Collection Time: 01/21/19  1:00 AM   Specimen: Nasopharyngeal Swab  Result Value Ref Range Status   SARS Coronavirus 2 by RT PCR POSITIVE (A) NEGATIVE Final    Comment: RESULT CALLED TO, READ BACK BY AND VERIFIED WITH: L VENEGAS RN 01/21/19 0225 JDW (NOTE) SARS-CoV-2 target nucleic acids are DETECTED. SARS-CoV-2 RNA is generally detectable in upper respiratory specimens  during the acute phase of infection. Positive results are indicative of the presence of the identified virus, but do not rule out bacterial infection or co-infection with other pathogens not detected by the test. Clinical correlation with patient history and other diagnostic information is necessary to determine patient infection status. The expected result is Negative. Fact Sheet for Patients:  PinkCheek.be Fact Sheet for Healthcare  Providers: GravelBags.it This test is not yet approved or cleared by the [place] FDA and  has been authorized for detection and/or diagnosis of SARS-CoV-2 by FDA under an Emergency Use Authorization (EUA).  This EUA will remain in effect (meaning this test can be used) for the  duration of  the COVID-19 declaration under Section 564(b)(1) of the Act, 21 U.S.C. section 360bbb-3(b)(1), unless the authorization is terminated or revoked sooner.    Influenza A by PCR NEGATIVE NEGATIVE Final   Influenza B by PCR NEGATIVE NEGATIVE Final    Comment: (NOTE) The Xpert Xpress SARS-CoV-2/FLU/RSV assay is intended as an aid in  the diagnosis of influenza from Nasopharyngeal swab specimens and  should not be used as a sole basis for treatment. Nasal washings and  aspirates are unacceptable for Xpert Xpress SARS-CoV-2/FLU/RSV  testing. Fact Sheet for Patients: PinkCheek.be Fact Sheet for Healthcare Providers: GravelBags.it This test is not yet approved or cleared by the [place] FDA and  has been authorized for detection and/or diagnosis of SARS-CoV-2 by  FDA under an Emergency Use Authorization (EUA). This EUA will remain  in effect (meaning this test can be used) for the duration of the  Covid-19 declaration under Section 564(b)(1) of the Act, 21  U.S.C. section 360bbb-3(b)(1), unless the authorization is  terminated or revoked. Performed at [facility], [place] [address] [place], [place] [address]   Culture, blood (routine x 2)     Status: None (Preliminary result)   Collection Time: 01/21/19  1:47 AM   Specimen: BLOOD  Result Value Ref Range Status   Specimen Description BLOOD RIGHT ANTECUBITAL  Final   Special Requests   Final    BOTTLES DRAWN AEROBIC AND ANAEROBIC Blood Culture results may not be optimal due to an inadequate volume of blood received in culture bottles   Culture   Final     NO GROWTH 4 DAYS Performed at [facility], [place] [address], [place], [place] [address]    Report Status PENDING  Incomplete         Radiology Studies: No results found.      Scheduled Meds: . amLODipine  5 mg Oral Daily  . Chlorhexidine Gluconate Cloth  6 each Topical Q0600  . Chlorhexidine Gluconate Cloth  6 each Topical  Q0600  . dexamethasone (DECADRON) injection  6 mg Intravenous Q24H  . heparin  5,000 Units Subcutaneous Q8H  . influenza vac split quadrivalent PF  0.5 mL Intramuscular Tomorrow-1000  . pantoprazole  40 mg Oral Q1200   Continuous Infusions: . sodium chloride    . sodium chloride    . remdesivir 100 mg in NS 100 mL 100 mg (01/24/19 1005)     LOS: 4 days    Time spent: 28min    Domenic Polite, MD [facility]  01/25/2019, 2:08 PM

## 2019-01-26 LAB — BASIC METABOLIC PANEL
Anion gap: 12 (ref 5–15)
BUN: 81 mg/dL — ABNORMAL HIGH (ref 8–23)
CO2: 24 mmol/L (ref 22–32)
Calcium: 7.7 mg/dL — ABNORMAL LOW (ref 8.9–10.3)
Chloride: 99 mmol/L (ref 98–111)
Creatinine, Ser: 7.77 mg/dL — ABNORMAL HIGH (ref 0.44–1.00)
GFR calc Af Amer: 6 mL/min — ABNORMAL LOW (ref >60)
GFR calc non Af Amer: 5 mL/min — ABNORMAL LOW (ref >60)
Glucose, Bld: 166 mg/dL — ABNORMAL HIGH (ref 70–99)
Potassium: 4 mmol/L (ref 3.5–5.1)
Sodium: 135 mmol/L (ref 135–145)

## 2019-01-26 LAB — CULTURE, BLOOD (ROUTINE X 2): Culture: NO GROWTH

## 2019-01-26 LAB — CBC
HCT: 22.6 % — ABNORMAL LOW (ref 36.0–46.0)
Hemoglobin: 7.8 g/dL — ABNORMAL LOW (ref 12.0–15.0)
MCH: 28 pg (ref 26.0–34.0)
MCHC: 34.5 g/dL (ref 30.0–36.0)
MCV: 81 fL (ref 80.0–100.0)
Platelets: 336 10*3/uL (ref 150–400)
RBC: 2.79 MIL/uL — ABNORMAL LOW (ref 3.87–5.11)
RDW: 14 % (ref 11.5–15.5)
WBC: 10 10*3/uL (ref 4.0–10.5)
nRBC: 0 % (ref 0.0–0.2)

## 2019-01-26 MED ORDER — DARBEPOETIN ALFA 100 MCG/0.5ML IJ SOSY
100.0000 ug | PREFILLED_SYRINGE | Freq: Once | INTRAMUSCULAR | Status: AC
  Administered 2019-01-26: 100 ug via SUBCUTANEOUS
  Filled 2019-01-26: qty 0.5

## 2019-01-26 MED ORDER — CHLORHEXIDINE GLUCONATE CLOTH 2 % EX PADS
6.0000 | MEDICATED_PAD | Freq: Every day | CUTANEOUS | Status: DC
  Administered 2019-01-27 – 2019-01-30 (×4): 6 via TOPICAL

## 2019-01-26 NOTE — Progress Notes (Signed)
PROGRESS NOTE    Tracy Escobar  [id number] DOB: 1955/01/23 DOA: 01/20/2019 PCP: Patient, No Pcp Per   Brief Narrative:  PCCM transfer to [facility] 1/12, Tracy Escobar is a 63/F with no significant past medical history was brought to the emergency room on 1/9 with encephalopathy, decreased p.o. intake for a few days. -The emergency room she was noted to have COVID-19 infection with pneumonia and severe acute kidney injury with a creatinine of 8/BUN of 140 from a baseline of 1.2. -She was admitted to the ICU, underwent temporary HD catheter placement and dialysis on 1/10 -Also treated with IV remdesivir and Decadron for pneumonia -Nephrology following -Transferred to [facility] service on 1/12 -Patient imprvoing but not making much urine. To undergo Dialysis again possibly in the AM    Assessment & Plan:   Active Problems:   Renal failure   COVID-19  AKI/ATN Hyperphosphatemia -Admitted with creatinine of 8.6 and BUN of 148, felt to be secondary to Covid, and prerenal component as well as NSAID use(naproxen on med list) -No recent labs in our system, baseline unknown, last creatinine  from 2009 was 1.2 -CT without hydronephrosis -Nephrology consulting, underwent temporary HD catheter placement on 1/9 and dialysis on 1/10 -Continue Foley catheter, strict I's/O's -Urine output of 900 mL recorded for previous 24 hours but only 50 cc the night before last and not making much urine today  -per Renal -Monitor labs, urine output -BUN/Cr went from 143/11.24 -> 81/7.77 -Nephrology to likely dialyze in the AM   COVID-19 pneumonia -Imaging on admission consistent with COVID-19 pneumonia -Day 5 of IV remdesivir and Decadron -Was also getting CAP coverage in ICU, will complete this after 5 days today -Inflammatory Markers: Recent Labs    01/25/19 0351  DDIMER 3.55*  FERRITIN 349*  CRP 4.8*    Lab Results  Component Value Date   SARSCOV2NAA POSITIVE (A) 01/21/2019  -C/w Antitussives -No  Longer requiring Supplemental O2 via [place] -Repeat CXR in AM   Acute metabolic Encephalopathy -Secondary to uremia, COVID-19 pneumonia -Resolved, AAO x3 now -Continue to Monitor Closely  Normocytic Anemia -Hemoglobin has gradually trended down from 10 > 9 >7.6 to 7.2 and this AM Hb/Hct was 7.8/22.6 and in the setting of Renal Disease -Patient denies any overt bleeding/melena or hematochezia, none noted by staff as well -Check anemia panel, trend and monitor -Continue DVT prophylaxis for now unless hemoglobin trends down further or overt bleeding noted  Hyperkalemia -Resolved with HD, renal diet  Hyperglycemia -Check hemoglobin A1c  Obesity  -Estimated body mass index is 31.65 kg/m as calculated from the following:   Height as of this encounter: 5\' 2"  (1.575 m).   Weight as of this encounter: 78.5 kg. -Weight Loss and Dietary Counseling    DVT prophylaxis: Heparin 5,000 units sq q8h Code Status: FULL CODE  Family Communication: No family present at bedside  Disposition Plan: Pending Improvement in Renal Fxn  Consultants:   Nephrology  Procedures: Dialysis   Antimicrobials:  Anti-infectives (From admission, onward)   Start     Dose/Rate Route Frequency Ordered Stop   01/22/19 1000  remdesivir 100 mg in sodium chloride 0.9 % 100 mL IVPB     100 mg 200 mL/hr over 30 Minutes Intravenous Daily 01/21/19 0552 01/25/19 1900   01/21/19 0800  azithromycin (ZITHROMAX) 500 mg in sodium chloride 0.9 % 250 mL IVPB     500 mg 250 mL/hr over 60 Minutes Intravenous Every 24 hours 01/21/19 0559 01/25/19 2025   01/21/19 0630  remdesivir 200 mg in sodium chloride 0.9% 250 mL IVPB     200 mg 580 mL/hr over 30 Minutes Intravenous Once 01/21/19 0552 01/21/19 0930   01/21/19 0600  cefTRIAXone (ROCEPHIN) 1 g in sodium chloride 0.9 % 100 mL IVPB     1 g 200 mL/hr over 30 Minutes Intravenous Every 24 hours 01/21/19 0559 01/25/19 0539     Subjective: Seen and examined at bedside and  states that she was doing well.  Denies any chest pain, lightheadedness or dizziness.  Still not making very much urine.  No nausea or vomiting.  No other concerns or complaints this time.  Objective: Vitals:   01/26/19 1153 01/26/19 1200 01/26/19 1600 01/26/19 1714  BP: (!) 152/69 129/73 134/71 137/72  Pulse: (!) 58 (!) 58 (!) 55 60  Resp: 20 18 18  (!) 21  Temp: 98 F (36.7 C)   98.1 F (36.7 C)  TempSrc: Oral   Oral  SpO2: 93% 91% 92% 91%  Weight:      Height:        Intake/Output Summary (Last 24 hours) at 01/26/2019 1836 Last data filed at 01/26/2019 1747 Gross per 24 hour  Intake 120 ml  Output 400 ml  Net -280 ml   Filed Weights   01/25/19 1230 01/25/19 1510 01/26/19 0433  Weight: 79 kg 79 kg 78.5 kg   Examination: Physical Exam:  Constitutional: WN/WD obese African-American female currently in NAD and appears calm and comfortable Eyes: Lids and conjunctivae normal, sclerae anicteric  ENMT: External Ears, Nose appear normal. Grossly normal hearing. Neck: Appears normal, supple, no cervical masses, normal ROM, no appreciable thyromegaly; no JVD Respiratory: Diminished to auscultation bilaterally with coarse breath sounds, no wheezing, rales, rhonchi or crackles. Normal respiratory effort and patient is not tachypenic. No accessory muscle use. Unlabored breathing and not wearing any supplemental O2 via [place] Cardiovascular: RRR, no murmurs / rubs / gallops. S1 and S2 auscultated. No extremity edema.  Abdomen: Soft, non-tender, Distended 2/2 body habitus. Bowel sounds positive x4.  GU: Deferred. Musculoskeletal: No clubbing / cyanosis of digits/nails. No joint deformity upper and lower extremities.  Skin: No rashes, lesions, ulcers on a limited skin evaluation. No induration; Warm and dry.  Neurologic: CN 2-12 grossly intact with no focal deficits. Romberg sign and cerebellar reflexes not assessed.  Psychiatric: Normal judgment and insight. Alert and oriented x 3. Pleasant  mood and appropriate affect.   Data Reviewed: I have personally reviewed following labs and imaging studies  CBC: Recent Labs  Lab 01/21/19 0547 01/22/19 2110 01/24/19 0419 01/25/19 0351 01/26/19 0223  WBC 12.8* 12.7* 9.7 10.2 10.0  NEUTROABS  --   --  7.8*  --   --   HGB 9.2* 10.0* 7.6* 7.2* 7.8*  HCT 27.1* 29.1* 22.2* 21.4* 22.6*  MCV 82.9 79.1* 81.9 81.4 81.0  PLT 341 418* 313 304 123456   Basic Metabolic Panel: Recent Labs  Lab 01/22/19 1220 01/23/19 0614 01/24/19 0419 01/25/19 0351 01/25/19 1111 01/26/19 0223  NA 139 137 136 135  --  135  K 4.0 3.8 4.3 4.2  --  4.0  CL 103 100 101 99  --  99  CO2 21* 22 23 19*  --  24  GLUCOSE 214* 138* 118* 146*  --  166*  BUN 144* 106* 124* 143*  --  81*  CREATININE 9.52* 7.88* 9.37* 11.24*  --  7.77*  CALCIUM 8.0* 7.7* 8.0* 7.8*  --  7.7*  PHOS 10.7* 8.6* 11.8*  --  >  30.0*  --    GFR: Estimated Creatinine Clearance: 7.3 mL/min (A) (by C-G formula based on SCr of 7.77 mg/dL (H)). Liver Function Tests: Recent Labs  Lab 01/20/19 2250 01/22/19 1220 01/23/19 0614 01/24/19 0419 01/25/19 0351  AST 16  --   --   --  20  ALT 10  --   --   --  15  ALKPHOS 42  --   --   --  29*  BILITOT 0.5  --   --   --  0.6  PROT 6.4*  --   --   --  4.7*  ALBUMIN 1.6* 1.3* 1.4* 1.3* 1.4*   No results for input(s): LIPASE, AMYLASE in the last 168 hours. No results for input(s): AMMONIA in the last 168 hours. Coagulation Profile: No results for input(s): INR, PROTIME in the last 168 hours. Cardiac Enzymes: Recent Labs  Lab 01/21/19 0547  CKTOTAL 65   BNP (last 3 results) No results for input(s): PROBNP in the last 8760 hours. HbA1C: Recent Labs    01/25/19 1111  HGBA1C 6.7*   CBG: Recent Labs  Lab 01/20/19 2344 01/21/19 0151  GLUCAP 117* 137*   Lipid Profile: No results for input(s): CHOL, HDL, LDLCALC, TRIG, CHOLHDL, LDLDIRECT in the last 72 hours. Thyroid Function Tests: No results for input(s): TSH, T4TOTAL, FREET4,  T3FREE, THYROIDAB in the last 72 hours. Anemia Panel: Recent Labs    01/24/19 1450 01/25/19 0351  VITAMINB12 571  --   FOLATE 9.0  --   FERRITIN  --  349*  TIBC 162*  --   IRON 88  --   RETICCTPCT 0.9  --    Sepsis Labs: Recent Labs  Lab 01/21/19 0142 01/21/19 0411 01/23/19 1454  PROCALCITON  --   --  3.63  LATICACIDVEN 1.4 1.1  --     Recent Results (from the past 240 hour(s))  Urine culture     Status: Abnormal   Collection Time: 01/21/19 12:48 AM   Specimen: Urine, Catheterized  Result Value Ref Range Status   Specimen Description URINE, CATHETERIZED  Final   Special Requests   Final    NONE Performed at [facility], [place] [address], [place], [place] [address]    Culture MULTIPLE SPECIES PRESENT, SUGGEST RECOLLECTION (A)  Final   Report Status 01/22/2019 FINAL  Final  Respiratory Panel by RT PCR (Flu A&B, Covid) - Nasopharyngeal Swab     Status: Abnormal   Collection Time: 01/21/19  1:00 AM   Specimen: Nasopharyngeal Swab  Result Value Ref Range Status   SARS Coronavirus 2 by RT PCR POSITIVE (A) NEGATIVE Final    Comment: RESULT CALLED TO, READ BACK BY AND VERIFIED WITH: L VENEGAS RN 01/21/19 0225 JDW (NOTE) SARS-CoV-2 target nucleic acids are DETECTED. SARS-CoV-2 RNA is generally detectable in upper respiratory specimens  during the acute phase of infection. Positive results are indicative of the presence of the identified virus, but do not rule out bacterial infection or co-infection with other pathogens not detected by the test. Clinical correlation with patient history and other diagnostic information is necessary to determine patient infection status. The expected result is Negative. Fact Sheet for Patients:  PinkCheek.be Fact Sheet for Healthcare Providers: GravelBags.it This test is not yet approved or cleared by the [place] FDA and  has been authorized for detection and/or diagnosis  of SARS-CoV-2 by FDA under an Emergency Use Authorization (EUA).  This EUA will remain in effect (meaning this test can  be used) for the  duration of  the COVID-19 declaration under Section 564(b)(1) of the Act, 21 U.S.C. section 360bbb-3(b)(1), unless the authorization is terminated or revoked sooner.    Influenza A by PCR NEGATIVE NEGATIVE Final   Influenza B by PCR NEGATIVE NEGATIVE Final    Comment: (NOTE) The Xpert Xpress SARS-CoV-2/FLU/RSV assay is intended as an aid in  the diagnosis of influenza from Nasopharyngeal swab specimens and  should not be used as a sole basis for treatment. Nasal washings and  aspirates are unacceptable for Xpert Xpress SARS-CoV-2/FLU/RSV  testing. Fact Sheet for Patients: PinkCheek.be Fact Sheet for Healthcare Providers: GravelBags.it This test is not yet approved or cleared by the [place] FDA and  has been authorized for detection and/or diagnosis of SARS-CoV-2 by  FDA under an Emergency Use Authorization (EUA). This EUA will remain  in effect (meaning this test can be used) for the duration of the  Covid-19 declaration under Section 564(b)(1) of the Act, 21  U.S.C. section 360bbb-3(b)(1), unless the authorization is  terminated or revoked. Performed at [facility], [place] [address] [place], [place] [address]   Culture, blood (routine x 2)     Status: None   Collection Time: 01/21/19  1:47 AM   Specimen: BLOOD  Result Value Ref Range Status   Specimen Description BLOOD RIGHT ANTECUBITAL  Final   Special Requests   Final    BOTTLES DRAWN AEROBIC AND ANAEROBIC Blood Culture results may not be optimal due to an inadequate volume of blood received in culture bottles   Culture   Final    NO GROWTH 5 DAYS Performed at [facility], [place] [address], [place], [place] [address]    Report Status 01/26/2019 FINAL  Final    Radiology Studies: No results found.  Scheduled  Meds: . amLODipine  5 mg Oral Daily  . Chlorhexidine Gluconate Cloth  6 each Topical Q0600  . Chlorhexidine Gluconate Cloth  6 each Topical Q0600  . darbepoetin (ARANESP) injection - NON-DIALYSIS  100 mcg Subcutaneous Q Wed-1800  . dexamethasone  6 mg Oral Daily  . heparin  5,000 Units Subcutaneous Q8H  . influenza vac split quadrivalent PF  0.5 mL Intramuscular Tomorrow-1000  . pantoprazole  40 mg Oral Q1200   Continuous Infusions: . sodium chloride    . sodium chloride       LOS: 5 days   Kerney Elbe, DO [facility] PAGER is on [place]  If 7PM-7AM, please contact night-coverage [email or web address]

## 2019-01-26 NOTE — Progress Notes (Addendum)
[facility] Progress Note   Subjective:    Tracy Escobar is a 64 yo F w/ PMh of HTN admit for COVID pneumonia on hospital day 5  Tracy Escobar was examined and evaluated at bedside this am. She was observed resting comfortably in bed. She mentions that she had difficult time tolerating HD yesterday due to muscle cramps of her lower extremities. She mentions that the cramps only occurred during HD and is now has no acute complaints at this time. Discussed continuing observation to see if her AKI resolves. Tracy Escobar expressed understanding.  Minimal UOP  Objective Vitals:   01/25/19 2050 01/26/19 0005 01/26/19 0433 01/26/19 0904  BP: (!) 157/86 (!) 154/96 (!) 147/79 (!) 155/68  Pulse: 61  64 63  Resp: (!) 22 17  20   Temp: 97.8 F (36.6 C) 97.9 F (36.6 C) 97.8 F (36.6 C) 98.1 F (36.7 C)  TempSrc: Oral Oral Oral Oral  SpO2: 95% 93% 94% 94%  Weight:   78.5 kg   Height:       Physical Exam  Gen: Well-developed, well nourished, NAD Neck: supple, ROM intact, no JVD Pulm: Distant breath sounds, No rales, no wheezes,  Abd: Soft, BS+, NTND, No rebound, no guarding Extm: ROM intact, Peripheral pulses intact, No peripheral edema Skin: Dry, Warm, normal turgor, no rashes, lesions, wounds.  Neuro: AAOx3 Dialysis access: Right IJ dialysis non-tunneled cath (placed 1/10)  Additional Objective Labs: Basic Metabolic Panel: Recent Labs  Lab 01/23/19 0614 01/23/19 0614 01/24/19 0419 01/25/19 0351 01/25/19 1111 01/26/19 0223  NA 137   < > 136 135  --  135  K 3.8   < > 4.3 4.2  --  4.0  CL 100   < > 101 99  --  99  CO2 22   < > 23 19*  --  24  GLUCOSE 138*   < > 118* 146*  --  166*  BUN 106*   < > 124* 143*  --  81*  CREATININE 7.88*   < > 9.37* 11.24*  --  7.77*  CALCIUM 7.7*   < > 8.0* 7.8*  --  7.7*  PHOS 8.6*  --  11.8*  --  >30.0*  --    < > = values in this interval not displayed.   Liver Function Tests: Recent Labs  Lab 01/20/19 2250 01/22/19 1220 01/23/19 0614  01/24/19 0419 01/25/19 0351  AST 16  --   --   --  20  ALT 10  --   --   --  15  ALKPHOS 42  --   --   --  29*  BILITOT 0.5  --   --   --  0.6  PROT 6.4*  --   --   --  4.7*  ALBUMIN 1.6*   < > 1.4* 1.3* 1.4*   < > = values in this interval not displayed.   No results for input(s): LIPASE, AMYLASE in the last 168 hours. CBC: Recent Labs  Lab 01/21/19 0547 01/21/19 0547 01/22/19 2110 01/22/19 2110 01/24/19 0419 01/25/19 0351 01/26/19 0223  WBC 12.8*   < > 12.7*   < > 9.7 10.2 10.0  NEUTROABS  --   --   --   --  7.8*  --   --   HGB 9.2*   < > 10.0*   < > 7.6* 7.2* 7.8*  HCT 27.1*   < > 29.1*   < > 22.2* 21.4* 22.6*  MCV 82.9  --  79.1*  --  81.9 81.4 81.0  PLT 341   < > 418*   < > 313 304 336   < > = values in this interval not displayed.   Blood Culture    Component Value Date/Time   SDES BLOOD RIGHT ANTECUBITAL 01/21/2019 0147   SPECREQUEST  01/21/2019 0147    BOTTLES DRAWN AEROBIC AND ANAEROBIC Blood Culture results may not be optimal due to an inadequate volume of blood received in culture bottles   CULT  01/21/2019 0147    NO GROWTH 5 DAYS Performed at [facility], [place] [address], [place], [place] [address]    REPTSTATUS 01/26/2019 FINAL 01/21/2019 0147    Cardiac Enzymes: Recent Labs  Lab 01/21/19 0547  CKTOTAL 65   CBG: Recent Labs  Lab 01/20/19 2344 01/21/19 0151  GLUCAP 117* 137*   Iron Studies:  Recent Labs    01/24/19 1450 01/25/19 0351  IRON 88  --   TIBC 162*  --   FERRITIN  --  349*   Studies/Results: No results found. Medications: . sodium chloride    . sodium chloride     . amLODipine  5 mg Oral Daily  . Chlorhexidine Gluconate Cloth  6 each Topical Q0600  . Chlorhexidine Gluconate Cloth  6 each Topical Q0600  . darbepoetin (ARANESP) injection - NON-DIALYSIS  100 mcg Subcutaneous Q Wed-1800  . dexamethasone  6 mg Oral Daily  . heparin  5,000 Units Subcutaneous Q8H  . influenza vac split quadrivalent PF  0.5 mL  Intramuscular Tomorrow-1000  . pantoprazole  40 mg Oral Q1200   Dialysis Orders: N/A  Assessment/Plan: 1. Acute Kidney Injury: On admission. BUN 147, Creatinine 9 with acidosis and hyperkalemia. Did not respond to diuretic. HD x2 (last session 1/13: UF goal 2000. I&Os not accurate (saw >100cc urine in bag yesterday). Tolerated HD w/ difficulty. Unclear if will make renal recovery. Am labs: K 4.0, Co2 24, Bun 81, Creatinine 7.77. May have component of diabetic nephropathy (hgb a1c at 6.7 although may not be accurate in setting of acute anemia) - Trend renal fx - Intermittent HD as needed - not planning for today.  Will put in orders for tomorrow but assess in AM - Avoid nephrotoxic meds - Strict I&Os, Daily Weights  2. COVID-19 infection: Improving respiratory status. Afebrile overnight. Finished course of Remdesivir and abx. On dexamethasone. - Per primary  3. Anemia: Acute drop in hemoglobin from 10 to 7.6 on 01/22/19. Currently stable at 7.8. No source of bleed. ESA given 01/25/19.  Iron sat is OK  - Monitor - Transfuse if <7  4 HTN: Am bp 155/68 this am - Should c/w improve with diuresis - C/w amlodipine 5mg    5. Nutrition: C/w renal diet w/ fluid restriction  Gilberto Better, MD PGY-2 [facility] Pager: [phone]  Attending attestation to follow  Patient seen and examined, agree with above note with above modifications. Pt looks good.  Numbers better after HD.  No HD needs today but not making urine.  Will write orders for tomorrow but assess her first for need  Corliss Parish, MD 01/26/2019

## 2019-01-27 ENCOUNTER — Inpatient Hospital Stay (HOSPITAL_COMMUNITY): Payer: HRSA Program

## 2019-01-27 LAB — COMPREHENSIVE METABOLIC PANEL
ALT: 20 U/L (ref 0–44)
AST: 19 U/L (ref 15–41)
Albumin: 1.5 g/dL — ABNORMAL LOW (ref 3.5–5.0)
Alkaline Phosphatase: 35 U/L — ABNORMAL LOW (ref 38–126)
Anion gap: 16 — ABNORMAL HIGH (ref 5–15)
BUN: 107 mg/dL — ABNORMAL HIGH (ref 8–23)
CO2: 21 mmol/L — ABNORMAL LOW (ref 22–32)
Calcium: 8 mg/dL — ABNORMAL LOW (ref 8.9–10.3)
Chloride: 97 mmol/L — ABNORMAL LOW (ref 98–111)
Creatinine, Ser: 9.12 mg/dL — ABNORMAL HIGH (ref 0.44–1.00)
GFR calc Af Amer: 5 mL/min — ABNORMAL LOW (ref >60)
GFR calc non Af Amer: 4 mL/min — ABNORMAL LOW (ref >60)
Glucose, Bld: 166 mg/dL — ABNORMAL HIGH (ref 70–99)
Potassium: 3.9 mmol/L (ref 3.5–5.1)
Sodium: 134 mmol/L — ABNORMAL LOW (ref 135–145)
Total Bilirubin: 0.7 mg/dL (ref 0.3–1.2)
Total Protein: 4.7 g/dL — ABNORMAL LOW (ref 6.5–8.1)

## 2019-01-27 LAB — CBC WITH DIFFERENTIAL/PLATELET
Abs Immature Granulocytes: 0.62 10*3/uL — ABNORMAL HIGH (ref 0.00–0.07)
Basophils Absolute: 0 10*3/uL (ref 0.0–0.1)
Basophils Relative: 0 %
Eosinophils Absolute: 0 10*3/uL (ref 0.0–0.5)
Eosinophils Relative: 0 %
HCT: 21.4 % — ABNORMAL LOW (ref 36.0–46.0)
Hemoglobin: 7.3 g/dL — ABNORMAL LOW (ref 12.0–15.0)
Immature Granulocytes: 5 %
Lymphocytes Relative: 6 %
Lymphs Abs: 0.8 10*3/uL (ref 0.7–4.0)
MCH: 27.8 pg (ref 26.0–34.0)
MCHC: 34.1 g/dL (ref 30.0–36.0)
MCV: 81.4 fL (ref 80.0–100.0)
Monocytes Absolute: 1.2 10*3/uL — ABNORMAL HIGH (ref 0.1–1.0)
Monocytes Relative: 9 %
Neutro Abs: 10.7 10*3/uL — ABNORMAL HIGH (ref 1.7–7.7)
Neutrophils Relative %: 80 %
Platelets: 331 10*3/uL (ref 150–400)
RBC: 2.63 MIL/uL — ABNORMAL LOW (ref 3.87–5.11)
RDW: 14.1 % (ref 11.5–15.5)
WBC: 13.3 10*3/uL — ABNORMAL HIGH (ref 4.0–10.5)
nRBC: 0 % (ref 0.0–0.2)

## 2019-01-27 LAB — LACTATE DEHYDROGENASE: LDH: 290 U/L — ABNORMAL HIGH (ref 98–192)

## 2019-01-27 LAB — FERRITIN: Ferritin: 329 ng/mL — ABNORMAL HIGH (ref 11–307)

## 2019-01-27 LAB — PHOSPHORUS: Phosphorus: 11.3 mg/dL — ABNORMAL HIGH (ref 2.5–4.6)

## 2019-01-27 LAB — FIBRINOGEN: Fibrinogen: 726 mg/dL — ABNORMAL HIGH (ref 210–475)

## 2019-01-27 LAB — D-DIMER, QUANTITATIVE: D-Dimer, Quant: 8.51 ug/mL-FEU — ABNORMAL HIGH (ref 0.00–0.50)

## 2019-01-27 LAB — GLUCOSE, CAPILLARY: Glucose-Capillary: 178 mg/dL — ABNORMAL HIGH (ref 70–99)

## 2019-01-27 LAB — MAGNESIUM: Magnesium: 2.1 mg/dL (ref 1.7–2.4)

## 2019-01-27 LAB — SEDIMENTATION RATE: Sed Rate: 105 mm/hr — ABNORMAL HIGH (ref 0–22)

## 2019-01-27 LAB — C-REACTIVE PROTEIN: CRP: 2.3 mg/dL — ABNORMAL HIGH (ref <1.0)

## 2019-01-27 MED ORDER — INSULIN ASPART 100 UNIT/ML ~~LOC~~ SOLN
0.0000 [IU] | Freq: Three times a day (TID) | SUBCUTANEOUS | Status: DC
  Administered 2019-01-28: 17:00:00 3 [IU] via SUBCUTANEOUS
  Administered 2019-01-30: 2 [IU] via SUBCUTANEOUS
  Administered 2019-01-30: 1 [IU] via SUBCUTANEOUS
  Administered 2019-01-31: 3 [IU] via SUBCUTANEOUS

## 2019-01-27 MED ORDER — HEPARIN SODIUM (PORCINE) 5000 UNIT/ML IJ SOLN
7500.0000 [IU] | Freq: Three times a day (TID) | INTRAMUSCULAR | Status: DC
  Administered 2019-01-27 – 2019-02-05 (×26): 7500 [IU] via SUBCUTANEOUS
  Filled 2019-01-27 (×25): qty 2

## 2019-01-27 NOTE — Progress Notes (Signed)
PROGRESS NOTE    Tracy Escobar  DPO:[id number] DOB: 01-02-1956 DOA: 01/20/2019 PCP: Patient, No Pcp Per   Brief Narrative:  PCCM transfer to [facility] 1/12, Tracy Escobar is a 63/F with no significant past medical history was brought to the emergency room on 1/9 with encephalopathy, decreased p.o. intake for a few days. -The emergency room she was noted to have COVID-19 infection with pneumonia and severe acute kidney injury with a creatinine of 8/BUN of 140 from a baseline of 1.2. -She was admitted to the ICU, underwent temporary HD catheter placement and dialysis on 1/10 -Also treated with IV remdesivir and Decadron for pneumonia -Nephrology following -Transferred to [facility] service on 1/12 -Patient imprvoing but renal function has worsened and patient to go for dialysis.  She complains of having some leg cramping during dialysis.   Assessment & Plan:   Active Problems:   Renal failure   COVID-19  AKI/ATN Hyperphosphatemia -Admitted with creatinine of 8.6 and BUN of 148, felt to be secondary to Covid, and prerenal component as well as NSAID use(naproxen on med list) -No recent labs in our system, baseline unknown, last creatinine  from 2009 was 1.2 -CT without hydronephrosis -Nephrology consulting, underwent temporary HD catheter placement on 1/9 and dialysis on 1/10 -Per Nephrology did not respond to Diuretic  -Continued Foley catheter, strict I's/O's -Still not having much Urinary Output -per Renal -Monitor labs, urine output -Patient's Phos Level was >30.0 and now was 11.3 -BUN/Cr went from 143/11.24 -> 81/7.77 -> 107/9.12 -Nephrology to likely dialyze again today; Patient complaining of Leg Cramping during Dialysis   COVID-19 pneumonia -Imaging on admission consistent with COVID-19 pneumonia -S/p 5 Days of Remdesivir; Continuing Decadron and today was Day 7  -Was also getting CAP coverage in ICU, will complete this after 5 days today -Inflammatory Markers: Recent Labs   01/25/19 0351 01/27/19 0201  DDIMER 3.55* 8.51*  FERRITIN 349* 329*  LDH  --  290*  CRP 4.8* 2.3*  -Fibrinogen was 726 -ESR was 105 Lab Results  Component Value Date   SARSCOV2NAA POSITIVE (A) 01/21/2019  -C/w Antitussives -No Longer requiring Supplemental O2 via  -Repeat CXR showed "Mild bilateral airspace disease with interval improvement."  Acute metabolic Encephalopathy -Secondary to uremia, COVID-19 pneumonia -Resolved, AAO x3 now -Continue to Monitor Closely  Normocytic Anemia -Hemoglobin has gradually trended down from 10 > 9 >7.6 to 7.2 and this AM Hb/Hct was 7.3/21.4 and in the setting of Renal Disease -Patient denies any overt bleeding/melena or hematochezia, none noted by staff as well -Check anemia panel, trend and monitor -Apologies recommending transfusing if hemoglobin drops below 7 -Continue DVT prophylaxis for now unless hemoglobin trends down further or overt bleeding noted  Hyperkalemia -Resolved with HD, renal diet -Now K+ is 3.9 -Continue to Monitor and Trend -Repeat CMP in AM   Hyperglycemia in the setting of Diabetes Mellitus Type 2 -Check Hemoglobin A1c and was 6.7 -Will add Sensitive Novolog SSI AC -Continue to Monitor Blood Sugars per Protocol   Obesity  -Estimated body mass index is 31.65 kg/m as calculated from the following:   Height as of this encounter: 5' 2"  (1.575 m).   Weight as of this encounter: 78.5 kg. -Weight Loss and Dietary Counseling   HTN -C/w Amlodipine   DVT prophylaxis: Heparin 5,000 units sq q8h Code Status: FULL CODE  Family Communication: No family present at bedside  Disposition Plan: Pending Improvement in Renal Fxn  Consultants:   Nephrology  Procedures: Dialysis   Antimicrobials:  Anti-infectives (  From admission, onward)   Start     Dose/Rate Route Frequency Ordered Stop   01/22/19 1000  remdesivir 100 mg in sodium chloride 0.9 % 100 mL IVPB     100 mg 200 mL/hr over 30 Minutes Intravenous  Daily 01/21/19 0552 01/25/19 1900   01/21/19 0800  azithromycin (ZITHROMAX) 500 mg in sodium chloride 0.9 % 250 mL IVPB     500 mg 250 mL/hr over 60 Minutes Intravenous Every 24 hours 01/21/19 0559 01/25/19 2025   01/21/19 0630  remdesivir 200 mg in sodium chloride 0.9% 250 mL IVPB     200 mg 580 mL/hr over 30 Minutes Intravenous Once 01/21/19 0552 01/21/19 0930   01/21/19 0600  cefTRIAXone (ROCEPHIN) 1 g in sodium chloride 0.9 % 100 mL IVPB     1 g 200 mL/hr over 30 Minutes Intravenous Every 24 hours 01/21/19 0559 01/25/19 0539     Subjective: Seen and examined at bedside and had no complaints and states she thinks she is making good urine.  States that whenever she was a dialysis she has significant amount of cramping in her legs.  No chest pain, lightheadedness or dizziness.  No nausea or vomiting.  No other concerns or complaints at and she is not short of breath.  Objective: Vitals:   01/27/19 0322 01/27/19 1348 01/27/19 1519 01/27/19 1530  BP: 138/76  (!) 152/71 (!) 156/71  Pulse: 63     Resp: 19     Temp: 98.4 F (36.9 C) 98.6 F (37 C)    TempSrc: Oral Oral    SpO2: 94%     Weight:      Height:        Intake/Output Summary (Last 24 hours) at 01/27/2019 1618 Last data filed at 01/27/2019 1230 Gross per 24 hour  Intake 570 ml  Output 100 ml  Net 470 ml   Filed Weights   01/25/19 1230 01/25/19 1510 01/26/19 0433  Weight: 79 kg 79 kg 78.5 kg   Examination: Physical Exam:  Constitutional: WN/WD obese AAF in NAD and appears calm and comfortable Eyes: Lids and conjunctivae normal, sclerae anicteric  ENMT: External Ears, Nose appear normal. Grossly normal hearing. Mucous membranes are moist.  Neck: Appears normal, supple, no cervical masses, normal ROM, no appreciable thyromegaly; no JVD Respiratory: Diminished to auscultation bilaterally with coarse breath sounds, no wheezing, rales, rhonchi or crackles. Normal respiratory effort and patient is not tachypenic. Unlabored  breathing and not wearing any supplemental O2 via [place] Cardiovascular: RRR, no murmurs / rubs / gallops. S1 and S2 auscultated. No extremity edema. 2+ pedal pulses. No carotid bruits.  Abdomen: Soft, non-tender, non-distended. Bowel sounds positive x4.  GU: Deferred. Musculoskeletal: No clubbing / cyanosis of digits/nails. No joint deformity upper and lower extremities.   Skin: No rashes, lesions, ulcers on a limited skin evaluation. No induration; Warm and dry.  Neurologic: CN 2-12 grossly intact with no focal deficits. Romberg sign and cerebellar reflexes not assessed.  Psychiatric: Normal judgment and insight. Alert and oriented x 3. Normal mood and appropriate affect.   Data Reviewed: I have personally reviewed following labs and imaging studies  CBC: Recent Labs  Lab 01/22/19 2110 01/24/19 0419 01/25/19 0351 01/26/19 0223 01/27/19 0201  WBC 12.7* 9.7 10.2 10.0 13.3*  NEUTROABS  --  7.8*  --   --  10.7*  HGB 10.0* 7.6* 7.2* 7.8* 7.3*  HCT 29.1* 22.2* 21.4* 22.6* 21.4*  MCV 79.1* 81.9 81.4 81.0 81.4  PLT 418* 313 304  336 320   Basic Metabolic Panel: Recent Labs  Lab 01/22/19 1220 01/22/19 1220 01/23/19 0614 01/24/19 0419 01/25/19 0351 01/25/19 1111 01/26/19 0223 01/27/19 0201  NA 139   < > 137 136 135  --  135 134*  K 4.0   < > 3.8 4.3 4.2  --  4.0 3.9  CL 103   < > 100 101 99  --  99 97*  CO2 21*   < > 22 23 19*  --  24 21*  GLUCOSE 214*   < > 138* 118* 146*  --  166* 166*  BUN 144*   < > 106* 124* 143*  --  81* 107*  CREATININE 9.52*   < > 7.88* 9.37* 11.24*  --  7.77* 9.12*  CALCIUM 8.0*   < > 7.7* 8.0* 7.8*  --  7.7* 8.0*  MG  --   --   --   --   --   --   --  2.1  PHOS 10.7*  --  8.6* 11.8*  --  >30.0*  --  11.3*   < > = values in this interval not displayed.   GFR: Estimated Creatinine Clearance: 6.2 mL/min (A) (by C-G formula based on SCr of 9.12 mg/dL (H)). Liver Function Tests: Recent Labs  Lab 01/20/19 2250 01/20/19 2250 01/22/19 1220 01/23/19 0614  01/24/19 0419 01/25/19 0351 01/27/19 0201  AST 16  --   --   --   --  20 19  ALT 10  --   --   --   --  15 20  ALKPHOS 42  --   --   --   --  29* 35*  BILITOT 0.5  --   --   --   --  0.6 0.7  PROT 6.4*  --   --   --   --  4.7* 4.7*  ALBUMIN 1.6*   < > 1.3* 1.4* 1.3* 1.4* 1.5*   < > = values in this interval not displayed.   No results for input(s): LIPASE, AMYLASE in the last 168 hours. No results for input(s): AMMONIA in the last 168 hours. Coagulation Profile: No results for input(s): INR, PROTIME in the last 168 hours. Cardiac Enzymes: Recent Labs  Lab 01/21/19 0547  CKTOTAL 65   BNP (last 3 results) No results for input(s): PROBNP in the last 8760 hours. HbA1C: Recent Labs    01/25/19 1111  HGBA1C 6.7*   CBG: Recent Labs  Lab 01/20/19 2344 01/21/19 0151  GLUCAP 117* 137*   Lipid Profile: No results for input(s): CHOL, HDL, LDLCALC, TRIG, CHOLHDL, LDLDIRECT in the last 72 hours. Thyroid Function Tests: No results for input(s): TSH, T4TOTAL, FREET4, T3FREE, THYROIDAB in the last 72 hours. Anemia Panel: Recent Labs    01/25/19 0351 01/27/19 0201  FERRITIN 349* 329*   Sepsis Labs: Recent Labs  Lab 01/21/19 0142 01/21/19 0411 01/23/19 1454  PROCALCITON  --   --  3.63  LATICACIDVEN 1.4 1.1  --     Recent Results (from the past 240 hour(s))  Urine culture     Status: Abnormal   Collection Time: 01/21/19 12:48 AM   Specimen: Urine, Catheterized  Result Value Ref Range Status   Specimen Description URINE, CATHETERIZED  Final   Special Requests   Final    NONE Performed at [facility], [place] [address], [place], [place] [address]    Culture MULTIPLE SPECIES PRESENT, SUGGEST RECOLLECTION (A)  Final  Report Status 01/22/2019 FINAL  Final  Respiratory Panel by RT PCR (Flu A&B, Covid) - Nasopharyngeal Swab     Status: Abnormal   Collection Time: 01/21/19  1:00 AM   Specimen: Nasopharyngeal Swab  Result Value Ref Range Status   SARS Coronavirus 2  by RT PCR POSITIVE (A) NEGATIVE Final    Comment: RESULT CALLED TO, READ BACK BY AND VERIFIED WITH: L VENEGAS RN 01/21/19 0225 JDW (NOTE) SARS-CoV-2 target nucleic acids are DETECTED. SARS-CoV-2 RNA is generally detectable in upper respiratory specimens  during the acute phase of infection. Positive results are indicative of the presence of the identified virus, but do not rule out bacterial infection or co-infection with other pathogens not detected by the test. Clinical correlation with patient history and other diagnostic information is necessary to determine patient infection status. The expected result is Negative. Fact Sheet for Patients:  PinkCheek.be Fact Sheet for Healthcare Providers: GravelBags.it This test is not yet approved or cleared by the [place] FDA and  has been authorized for detection and/or diagnosis of SARS-CoV-2 by FDA under an Emergency Use Authorization (EUA).  This EUA will remain in effect (meaning this test can be used) for the  duration of  the COVID-19 declaration under Section 564(b)(1) of the Act, 21 U.S.C. section 360bbb-3(b)(1), unless the authorization is terminated or revoked sooner.    Influenza A by PCR NEGATIVE NEGATIVE Final   Influenza B by PCR NEGATIVE NEGATIVE Final    Comment: (NOTE) The Xpert Xpress SARS-CoV-2/FLU/RSV assay is intended as an aid in  the diagnosis of influenza from Nasopharyngeal swab specimens and  should not be used as a sole basis for treatment. Nasal washings and  aspirates are unacceptable for Xpert Xpress SARS-CoV-2/FLU/RSV  testing. Fact Sheet for Patients: PinkCheek.be Fact Sheet for Healthcare Providers: GravelBags.it This test is not yet approved or cleared by the [place] FDA and  has been authorized for detection and/or diagnosis of SARS-CoV-2 by  FDA under an Emergency Use  Authorization (EUA). This EUA will remain  in effect (meaning this test can be used) for the duration of the  Covid-19 declaration under Section 564(b)(1) of the Act, 21  U.S.C. section 360bbb-3(b)(1), unless the authorization is  terminated or revoked. Performed at [facility], [place] [address] [place], [place] [address]   Culture, blood (routine x 2)     Status: None   Collection Time: 01/21/19  1:47 AM   Specimen: BLOOD  Result Value Ref Range Status   Specimen Description BLOOD RIGHT ANTECUBITAL  Final   Special Requests   Final    BOTTLES DRAWN AEROBIC AND ANAEROBIC Blood Culture results may not be optimal due to an inadequate volume of blood received in culture bottles   Culture   Final    NO GROWTH 5 DAYS Performed at [facility], [place] [address] [place], [place] [address]    Report Status 01/26/2019 FINAL  Final    Radiology Studies: DG CHEST PORT 1 VIEW  Result Date: 01/27/2019 CLINICAL DATA:  Short of breath EXAM: PORTABLE CHEST 1 VIEW COMPARISON:  01/21/2019 FINDINGS: Mild airspace disease bilaterally shows interval improvement. No new area of consolidation or effusion. Central line tip in the lower SVC unchanged. IMPRESSION: Mild bilateral airspace disease with interval improvement. Electronically Signed   By: Franchot Gallo M.D.   On: 01/27/2019 07:59    Scheduled Meds: . amLODipine  5 mg Oral Daily  . Chlorhexidine Gluconate Cloth  6 each Topical  [id number]  . Chlorhexidine Gluconate Cloth  6 each Topical Q0600  . Chlorhexidine Gluconate Cloth  6 each Topical Q0600  . darbepoetin (ARANESP) injection - NON-DIALYSIS  100 mcg Subcutaneous Q Wed-1800  . dexamethasone  6 mg Oral Daily  . heparin  7,500 Units Subcutaneous Q8H  . influenza vac split quadrivalent PF  0.5 mL Intramuscular Tomorrow-1000  . pantoprazole  40 mg Oral Q1200   Continuous Infusions: . sodium chloride    . sodium chloride       LOS: 6 days   Kerney Elbe, DO [facility] PAGER is on [place]  If 7PM-7AM, please contact night-coverage [email or web address]

## 2019-01-27 NOTE — Progress Notes (Addendum)
Tracy Escobar Progress Note   Subjective:    Tracy Escobar is a 64 yo F w/ PMh of HTN admit for COVID pneumonia on hospital day 6  Tracy Escobar was examined and evaluated at bedside this am. She was observed resting comfortably in bed. Mentions she feels tired because she stayed up too late overnight watching TV. Denies any chest pain, palpitations, dyspnea.  Objective Vitals:   01/26/19 1714 01/26/19 1900 01/26/19 2356 01/27/19 0322  BP: 137/72 (!) 148/80 (!) 156/67 138/76  Pulse: 60   63  Resp: (!) 21   19  Temp: 98.1 F (36.7 C) 97.9 F (36.6 C) 98.1 F (36.7 C) 98.4 F (36.9 C)  TempSrc: Oral  Oral Oral  SpO2: 91%   94%  Weight:      Height:       Physical Exam  Gen: Well-developed, well nourished, NAD Neck: supple, ROM intact, Right Ij cath in place Pulm: Distant breath sounds, No rales, no wheezes,  Abd: Soft, BS+, NTND, No rebound, no guarding Extm: Peripheral pulses intact, Trace ankle edema Skin: Dry, Warm, normal turgor, no rashes, lesions, wounds.  Neuro: AAOx3 Dialysis access: Right IJ dialysis non-tunneled cath (placed 1/10)  Additional Objective Labs: Basic Metabolic Panel: Recent Labs  Lab 01/24/19 0419 01/24/19 0419 01/25/19 0351 01/25/19 1111 01/26/19 0223 01/27/19 0201  NA 136   < > 135  --  135 134*  K 4.3   < > 4.2  --  4.0 3.9  CL 101   < > 99  --  99 97*  CO2 23   < > 19*  --  24 21*  GLUCOSE 118*   < > 146*  --  166* 166*  BUN 124*   < > 143*  --  81* 107*  CREATININE 9.37*   < > 11.24*  --  7.77* 9.12*  CALCIUM 8.0*   < > 7.8*  --  7.7* 8.0*  PHOS 11.8*  --   --  >30.0*  --  11.3*   < > = values in this interval not displayed.   Liver Function Tests: Recent Labs  Lab 01/20/19 2250 01/22/19 1220 01/24/19 0419 01/25/19 0351 01/27/19 0201  AST 16  --   --  20 19  ALT 10  --   --  15 20  ALKPHOS 42  --   --  29* 35*  BILITOT 0.5  --   --  0.6 0.7  PROT 6.4*  --   --  4.7* 4.7*  ALBUMIN 1.6*   < > 1.3* 1.4* 1.5*   < > =  values in this interval not displayed.   No results for input(s): LIPASE, AMYLASE in the last 168 hours. CBC: Recent Labs  Lab 01/22/19 2110 01/22/19 2110 01/24/19 0419 01/24/19 0419 01/25/19 0351 01/26/19 0223 01/27/19 0201  WBC 12.7*   < > 9.7   < > 10.2 10.0 13.3*  NEUTROABS  --   --  7.8*  --   --   --  10.7*  HGB 10.0*   < > 7.6*   < > 7.2* 7.8* 7.3*  HCT 29.1*   < > 22.2*   < > 21.4* 22.6* 21.4*  MCV 79.1*  --  81.9  --  81.4 81.0 81.4  PLT 418*   < > 313   < > 304 336 331   < > = values in this interval not displayed.   Blood Culture    Component Value Date/Time  SDES BLOOD RIGHT ANTECUBITAL 01/21/2019 0147   SPECREQUEST  01/21/2019 0147    BOTTLES DRAWN AEROBIC AND ANAEROBIC Blood Culture results may not be optimal due to an inadequate volume of blood received in culture bottles   CULT  01/21/2019 0147    NO GROWTH 5 DAYS Performed at [facility], [place] [address] [place], [place] [address]    REPTSTATUS 01/26/2019 FINAL 01/21/2019 0147    Cardiac Enzymes: Recent Labs  Lab 01/21/19 0547  CKTOTAL 65   CBG: Recent Labs  Lab 01/20/19 2344 01/21/19 0151  GLUCAP 117* 137*   Iron Studies:  Recent Labs    01/24/19 1450 01/25/19 0351 01/27/19 0201  IRON 88  --   --   TIBC 162*  --   --   FERRITIN  --    < > 329*   < > = values in this interval not displayed.   Studies/Results: DG CHEST PORT 1 VIEW  Result Date: 01/27/2019 CLINICAL DATA:  Short of breath EXAM: PORTABLE CHEST 1 VIEW COMPARISON:  01/21/2019 FINDINGS: Mild airspace disease bilaterally shows interval improvement. No new area of consolidation or effusion. Central line tip in the lower SVC unchanged. IMPRESSION: Mild bilateral airspace disease with interval improvement. Electronically Signed   By: Franchot Gallo M.D.   On: 01/27/2019 07:59   Medications: . sodium chloride    . sodium chloride     . amLODipine  5 mg Oral Daily  . Chlorhexidine Gluconate Cloth  6 each Topical Q0600   . Chlorhexidine Gluconate Cloth  6 each Topical Q0600  . Chlorhexidine Gluconate Cloth  6 each Topical Q0600  . darbepoetin (ARANESP) injection - NON-DIALYSIS  100 mcg Subcutaneous Q Wed-1800  . dexamethasone  6 mg Oral Daily  . heparin  7,500 Units Subcutaneous Q8H  . influenza vac split quadrivalent PF  0.5 mL Intramuscular Tomorrow-1000  . pantoprazole  40 mg Oral Q1200   Dialysis Orders: N/A  Assessment/Plan: 1. Acute Kidney Injury: On admission. BUN 147, Creatinine 9 with acidosis and hyperkalemia. Did not respond to diuretic. HD x2 (last HD 1/13).  Tolerated HD w/ difficulty. 300cc urine output. Unclear if will make renal recovery.  Do not know renal baseline (crt 1.2 but in 2009) - urine 100 of protein - no RBC-  Kidneys unremarkable on CT.  Am labs: K 3.9, Co2 21, Bun 107, Creatinine 9.12. Acute worsening in renal function, still low urine output planned for HD today - Trend renal fx - Planned for HD today then PRN as schedule allows - Avoid nephrotoxic meds - Strict I&Os, Daily Weights    2. COVID-19 infection: Improving respiratory status. Afebrile overnight. Finished course of Remdesivir and abx. On dexamethasone. - Per primary  3. Anemia: Acute drop in hemoglobin from 10 to 7.6 on 01/22/19. Currently stable at 7.3. No source of bleed. ESA given 01/25/19. Iron sat is good - Monitor - Transfuse if <7  4 HTN: Am bp 155/68 this am - Should c/w improve with diuresis/UF - C/w amlodipine 5mg    5. Nutrition: C/w renal diet w/ fluid restriction  Tracy Better, MD PGY-2 Tracy Escobar Pager: [phone]  Attending attestation to follow  Patient seen and examined, agree with above note with above modifications. 64 year old BF-  crt 1.2 in 2009 but then stopped going to MDs.  Presented with COVID and crt in the 8's- possible NSAIDS.  Has required dialysis here twice, planning for 3rd treatment today. Difficult to tell but possibly having  inc UOP.  Will cont to monitor labs and  need for HD daily.  On ESA for anemia   Tracy Parish, MD 01/27/2019

## 2019-01-27 NOTE — Progress Notes (Signed)
Pt to dialysis.

## 2019-01-28 DIAGNOSIS — D72829 Elevated white blood cell count, unspecified: Secondary | ICD-10-CM

## 2019-01-28 LAB — CBC WITH DIFFERENTIAL/PLATELET
Abs Immature Granulocytes: 0.89 10*3/uL — ABNORMAL HIGH (ref 0.00–0.07)
Basophils Absolute: 0 10*3/uL (ref 0.0–0.1)
Basophils Relative: 0 %
Eosinophils Absolute: 0 10*3/uL (ref 0.0–0.5)
Eosinophils Relative: 0 %
HCT: 22.1 % — ABNORMAL LOW (ref 36.0–46.0)
Hemoglobin: 7.3 g/dL — ABNORMAL LOW (ref 12.0–15.0)
Immature Granulocytes: 6 %
Lymphocytes Relative: 10 %
Lymphs Abs: 1.4 10*3/uL (ref 0.7–4.0)
MCH: 27.5 pg (ref 26.0–34.0)
MCHC: 33 g/dL (ref 30.0–36.0)
MCV: 83.4 fL (ref 80.0–100.0)
Monocytes Absolute: 1.1 10*3/uL — ABNORMAL HIGH (ref 0.1–1.0)
Monocytes Relative: 7 %
Neutro Abs: 11.2 10*3/uL — ABNORMAL HIGH (ref 1.7–7.7)
Neutrophils Relative %: 77 %
Platelets: 347 10*3/uL (ref 150–400)
RBC: 2.65 MIL/uL — ABNORMAL LOW (ref 3.87–5.11)
RDW: 14.5 % (ref 11.5–15.5)
WBC: 14.6 10*3/uL — ABNORMAL HIGH (ref 4.0–10.5)
nRBC: 0.2 % (ref 0.0–0.2)

## 2019-01-28 LAB — FIBRINOGEN: Fibrinogen: 636 mg/dL — ABNORMAL HIGH (ref 210–475)

## 2019-01-28 LAB — COMPREHENSIVE METABOLIC PANEL
ALT: 26 U/L (ref 0–44)
AST: 24 U/L (ref 15–41)
Albumin: 1.5 g/dL — ABNORMAL LOW (ref 3.5–5.0)
Alkaline Phosphatase: 30 U/L — ABNORMAL LOW (ref 38–126)
Anion gap: 13 (ref 5–15)
BUN: 70 mg/dL — ABNORMAL HIGH (ref 8–23)
CO2: 26 mmol/L (ref 22–32)
Calcium: 7.8 mg/dL — ABNORMAL LOW (ref 8.9–10.3)
Chloride: 98 mmol/L (ref 98–111)
Creatinine, Ser: 6.47 mg/dL — ABNORMAL HIGH (ref 0.44–1.00)
GFR calc Af Amer: 7 mL/min — ABNORMAL LOW (ref >60)
GFR calc non Af Amer: 6 mL/min — ABNORMAL LOW (ref >60)
Glucose, Bld: 98 mg/dL (ref 70–99)
Potassium: 3.8 mmol/L (ref 3.5–5.1)
Sodium: 137 mmol/L (ref 135–145)
Total Bilirubin: 0.3 mg/dL (ref 0.3–1.2)
Total Protein: 4.7 g/dL — ABNORMAL LOW (ref 6.5–8.1)

## 2019-01-28 LAB — MAGNESIUM: Magnesium: 1.8 mg/dL (ref 1.7–2.4)

## 2019-01-28 LAB — PHOSPHORUS: Phosphorus: 7.9 mg/dL — ABNORMAL HIGH (ref 2.5–4.6)

## 2019-01-28 LAB — C-REACTIVE PROTEIN: CRP: 1.6 mg/dL — ABNORMAL HIGH (ref <1.0)

## 2019-01-28 LAB — GLUCOSE, CAPILLARY
Glucose-Capillary: 102 mg/dL — ABNORMAL HIGH (ref 70–99)
Glucose-Capillary: 115 mg/dL — ABNORMAL HIGH (ref 70–99)
Glucose-Capillary: 242 mg/dL — ABNORMAL HIGH (ref 70–99)
Glucose-Capillary: 233 mg/dL — ABNORMAL HIGH (ref 70–99)

## 2019-01-28 LAB — SEDIMENTATION RATE: Sed Rate: 100 mm/hr — ABNORMAL HIGH (ref 0–22)

## 2019-01-28 LAB — LACTATE DEHYDROGENASE: LDH: 287 U/L — ABNORMAL HIGH (ref 98–192)

## 2019-01-28 LAB — D-DIMER, QUANTITATIVE: D-Dimer, Quant: 12.47 ug/mL-FEU — ABNORMAL HIGH (ref 0.00–0.50)

## 2019-01-28 LAB — FERRITIN: Ferritin: 322 ng/mL — ABNORMAL HIGH (ref 11–307)

## 2019-01-28 NOTE — Progress Notes (Signed)
PROGRESS NOTE    Tracy Escobar  MWU:[id number] DOB: July 26, 1955 DOA: 01/20/2019 PCP: Patient, No Pcp Per   Brief Narrative:  PCCM transfer to [facility] 1/12, [name] is a 63/F with no significant past medical history was brought to the emergency room on 1/9 with encephalopathy, decreased p.o. intake for a few days. -The emergency room she was noted to have COVID-19 infection with pneumonia and severe acute kidney injury with a creatinine of 8/BUN of 140 from a baseline of 1.2. -She was admitted to the ICU, underwent temporary HD catheter placement and dialysis on 1/10 -Also treated with IV remdesivir and Decadron for pneumonia -Nephrology following -Transferred to [facility] service on 1/12 -Patient imprvoing but renal function has worsened and patient to go for dialysis.  She complains of having some leg cramping during dialysis.  -On 01/28/2019 she was wearing supplemental oxygen and was slightly confused.  Pete labs showing that inflammatory markers are trending down and WBC is elevated in the setting of steroid demargination.   Assessment & Plan:   Active Problems:   Renal failure   COVID-19  AKI/ATN Hyperphosphatemia -Admitted with creatinine of 8.6 and BUN of 148, felt to be secondary to Covid, and prerenal component as well as NSAID use(naproxen on med list) -No recent labs in our system, baseline unknown, last creatinine  from 2009 was 1.2 -CT without hydronephrosis -Nephrology consulting, underwent temporary HD catheter placement on 1/9 and dialysis on 1/10 -Per Nephrology did not respond to Diuretic  -Phos Level is now 7.9 -Continued Foley catheter, strict I's/O's -Still not having much Urinary Output -per Renal -Monitor labs, urine output -Patient's Phos Level was >30.0 and now was 11.3 -BUN/Cr went from 143/11.24 -> 81/7.77 -> 107/9.12 -> 70/6.47 -Nephrology dialyzed yesterday; Patient complaining of Leg Cramping during Dialysis -Further care per nephrology and appreciate  their evaluation  COVID-19 pneumonia -Imaging on admission consistent with COVID-19 pneumonia -S/p 5 Days of Remdesivir; Continuing Decadron and today was Day 5  -Was also getting CAP coverage in ICU, will complete this after 5 days today -Inflammatory Markers: Recent Labs    01/27/19 0201 01/28/19 0511  DDIMER 8.51* 12.47*  FERRITIN 329* 322*  LDH 290* 287*  CRP 2.3* 1.6*  -Fibrinogen was 726 and is now improved to 636 -PCT was 3.63 and not rechecked so will repeat in AM  -ESR was 105 is now improved to 100 -Because D-dimer is elevating her heparin dosing has been increased from 5000 units subcu q8h to 7,500 subcu units every 8h Lab Results  Component Value Date   SARSCOV2NAA POSITIVE (A) 01/21/2019  -SpO2: 95 % O2 Flow Rate (L/min): 2 L/min; he ended up back on 2 L of supplemental oxygen via nasal cannula and will need to wean as tolerated; desaturated down to 86% early this morning -C/w Antitussives with Robitussin-DM 10 mL every 4 hours as needed cough -Repeat CXR on 01/27/2019 showed "Mild bilateral airspace disease with interval improvement." -Repeat chest x-ray in the a.m.  Acute metabolic Encephalopathy -Secondary to uremia, COVID-19 pneumonia -Had improved but she was little confused this morning when I went to see her but she had just woken up.  Started stuttering a little bit and felt "foggy" -Continue to Monitor Closely  Normocytic Anemia -Hemoglobin has gradually trended and has been stable in the setting of renal disease and is now 7.3/22.1 today -Patient denies any overt bleeding/melena or hematochezia, none noted by staff as well -Check anemia panel, trend and monitor -Apologies recommending transfusing if hemoglobin drops  below 7 -Continue DVT prophylaxis for now unless hemoglobin trends down further or overt bleeding noted  Hyperkalemia -Resolved with HD, renal diet -Now K+ is 3.8 -Continue to Monitor and Trend -Repeat CMP in AM   Hyperglycemia in  the setting of Diabetes Mellitus Type 2 -Check Hemoglobin A1c and was 6.7 -Will add Sensitive Novolog SSI AC -CBGs ranging from 102-178 -Continue to Monitor Blood Sugars per Protocol   Obesity  -Estimated body mass index is 31.05 kg/m as calculated from the following:   Height as of this encounter: 5' 2"  (1.575 m).   Weight as of this encounter: 77 kg. -Weight Loss and Dietary Counseling   HTN -C/w Amlodipine 5 mg po Daily   Leukocytosis -Worsening in the setting of IV steroid demargination -Patient's WBC is now 14.6 -Continue to monitor for signs or symptoms of infection -Repeat CBC in a.m.  DVT prophylaxis: Heparin 5,000 units sq q8h Code Status: FULL CODE  Family Communication: No family present at bedside  Disposition Plan: Pending Improvement in Renal Fxn and respiratory status  Consultants:   Nephrology  Procedures: Dialysis   Antimicrobials:  Anti-infectives (From admission, onward)   Start     Dose/Rate Route Frequency Ordered Stop   01/22/19 1000  remdesivir 100 mg in sodium chloride 0.9 % 100 mL IVPB     100 mg 200 mL/hr over 30 Minutes Intravenous Daily 01/21/19 0552 01/25/19 1900   01/21/19 0800  azithromycin (ZITHROMAX) 500 mg in sodium chloride 0.9 % 250 mL IVPB     500 mg 250 mL/hr over 60 Minutes Intravenous Every 24 hours 01/21/19 0559 01/25/19 2025   01/21/19 0630  remdesivir 200 mg in sodium chloride 0.9% 250 mL IVPB     200 mg 580 mL/hr over 30 Minutes Intravenous Once 01/21/19 0552 01/21/19 0930   01/21/19 0600  cefTRIAXone (ROCEPHIN) 1 g in sodium chloride 0.9 % 100 mL IVPB     1 g 200 mL/hr over 30 Minutes Intravenous Every 24 hours 01/21/19 0559 01/25/19 0539     Subjective: Seen and examined at bedside she was awoken from sleep and she was somewhat confused.  She was unable to express what she was thinking and had trouble communicating with me slightly and started stuttering but and I spoke with nursing and on nursing's evaluation she was  appropriate and alert oriented x3 with no issues communicating with her later on.  No chest pain, lightheadedness or dizziness.  No other concerns or complaints at this time.  Objective: Vitals:   01/27/19 1959 01/28/19 0532 01/28/19 0551 01/28/19 1147  BP: (!) 154/87   (!) 145/73  Pulse: 61 64  65  Resp: (!) 24 (!) 22    Temp: 98.3 F (36.8 C)  98.4 F (36.9 C) 98.3 F (36.8 C)  TempSrc: Oral  Oral Oral  SpO2: 91% (!) 86% 90% 95%  Weight:  77 kg    Height:        Intake/Output Summary (Last 24 hours) at 01/28/2019 1447 Last data filed at 01/28/2019 0554 Gross per 24 hour  Intake 240 ml  Output 500 ml  Net -260 ml   Filed Weights   01/25/19 1510 01/26/19 0433 01/28/19 0532  Weight: 79 kg 78.5 kg 77 kg   Examination: Physical Exam:  Constitutional: WN/WD obese AAF in NAD and appears a little anxious Eyes: Lids and conjunctivae normal, sclerae anicteric  ENMT: External Ears, Nose appear normal. Grossly normal hearing. Mucous membranes are moist.  Neck: Appears normal, supple,  no cervical masses, normal ROM, no appreciable thyromegaly; no JVD Respiratory: Diminished to auscultation bilaterally with coarse breath sounds, no wheezing, rales, rhonchi or crackles. Normal respiratory effort and patient is not tachypenic. No accessory muscle use. Wearing 2 Liters of Supplemental O2 via [place] Cardiovascular: RRR, no murmurs / rubs / gallops. S1 and S2 auscultated. Trace extremity edema.  Abdomen: Soft, non-tender, Distended 2/2 to body habitus. No masses palpated. No appreciable hepatosplenomegaly. Bowel sounds positive.  GU: Deferred. Musculoskeletal: No clubbing / cyanosis of digits/nails. No joint deformity upper and lower extremities.  Skin: No rashes, lesions, ulcers on a limited skin evaluation. No induration; Warm and dry.  Neurologic: CN 2-12 grossly intact with no focal deficits but did have trouble word finding. Romberg sign and cerebellar reflexes not assessed.  Psychiatric:  Appeared anxious.   Data Reviewed: I have personally reviewed following labs and imaging studies  CBC: Recent Labs  Lab 01/24/19 0419 01/25/19 0351 01/26/19 0223 01/27/19 0201 01/28/19 0511  WBC 9.7 10.2 10.0 13.3* 14.6*  NEUTROABS 7.8*  --   --  10.7* 11.2*  HGB 7.6* 7.2* 7.8* 7.3* 7.3*  HCT 22.2* 21.4* 22.6* 21.4* 22.1*  MCV 81.9 81.4 81.0 81.4 83.4  PLT 313 304 336 331 628   Basic Metabolic Panel: Recent Labs  Lab 01/23/19 0614 01/23/19 0614 01/24/19 0419 01/25/19 0351 01/25/19 1111 01/26/19 0223 01/27/19 0201 01/28/19 0511  NA 137   < > 136 135  --  135 134* 137  K 3.8   < > 4.3 4.2  --  4.0 3.9 3.8  CL 100   < > 101 99  --  99 97* 98  CO2 22   < > 23 19*  --  24 21* 26  GLUCOSE 138*   < > 118* 146*  --  166* 166* 98  BUN 106*   < > 124* 143*  --  81* 107* 70*  CREATININE 7.88*   < > 9.37* 11.24*  --  7.77* 9.12* 6.47*  CALCIUM 7.7*   < > 8.0* 7.8*  --  7.7* 8.0* 7.8*  MG  --   --   --   --   --   --  2.1 1.8  PHOS 8.6*  --  11.8*  --  >30.0*  --  11.3* 7.9*   < > = values in this interval not displayed.   GFR: Estimated Creatinine Clearance: 8.7 mL/min (A) (by C-G formula based on SCr of 6.47 mg/dL (H)). Liver Function Tests: Recent Labs  Lab 01/23/19 0614 01/24/19 0419 01/25/19 0351 01/27/19 0201 01/28/19 0511  AST  --   --  20 19 24   ALT  --   --  15 20 26   ALKPHOS  --   --  29* 35* 30*  BILITOT  --   --  0.6 0.7 0.3  PROT  --   --  4.7* 4.7* 4.7*  ALBUMIN 1.4* 1.3* 1.4* 1.5* 1.5*   No results for input(s): LIPASE, AMYLASE in the last 168 hours. No results for input(s): AMMONIA in the last 168 hours. Coagulation Profile: No results for input(s): INR, PROTIME in the last 168 hours. Cardiac Enzymes: No results for input(s): CKTOTAL, CKMB, CKMBINDEX, TROPONINI in the last 168 hours. BNP (last 3 results) No results for input(s): PROBNP in the last 8760 hours. HbA1C: No results for input(s): HGBA1C in the last 72 hours. CBG: Recent Labs  Lab  01/27/19 2232 01/28/19 0756 01/28/19 1144  GLUCAP 178* 102* 115*  Lipid Profile: No results for input(s): CHOL, HDL, LDLCALC, TRIG, CHOLHDL, LDLDIRECT in the last 72 hours. Thyroid Function Tests: No results for input(s): TSH, T4TOTAL, FREET4, T3FREE, THYROIDAB in the last 72 hours. Anemia Panel: Recent Labs    01/27/19 0201 01/28/19 0511  FERRITIN 329* 322*   Sepsis Labs: Recent Labs  Lab 01/23/19 1454  PROCALCITON 3.63    Recent Results (from the past 240 hour(s))  Urine culture     Status: Abnormal   Collection Time: 01/21/19 12:48 AM   Specimen: Urine, Catheterized  Result Value Ref Range Status   Specimen Description URINE, CATHETERIZED  Final   Special Requests   Final    NONE Performed at [facility], [address], [place], [place] [address]    Culture MULTIPLE SPECIES PRESENT, SUGGEST RECOLLECTION (A)  Final   Report Status 01/22/2019 FINAL  Final  Respiratory Panel by RT PCR (Flu A&B, Covid) - Nasopharyngeal Swab     Status: Abnormal   Collection Time: 01/21/19  1:00 AM   Specimen: Nasopharyngeal Swab  Result Value Ref Range Status   SARS Coronavirus 2 by RT PCR POSITIVE (A) NEGATIVE Final    Comment: RESULT CALLED TO, READ BACK BY AND VERIFIED WITH: L VENEGAS RN 01/21/19 0225 JDW (NOTE) SARS-CoV-2 target nucleic acids are DETECTED. SARS-CoV-2 RNA is generally detectable in upper respiratory specimens  during the acute phase of infection. Positive results are indicative of the presence of the identified virus, but do not rule out bacterial infection or co-infection with other pathogens not detected by the test. Clinical correlation with patient history and other diagnostic information is necessary to determine patient infection status. The expected result is Negative. Fact Sheet for Patients:  PinkCheek.be Fact Sheet for Healthcare Providers: GravelBags.it This test is not yet approved or  cleared by the [place] FDA and  has been authorized for detection and/or diagnosis of SARS-CoV-2 by FDA under an Emergency Use Authorization (EUA).  This EUA will remain in effect (meaning this test can be used) for the  duration of  the COVID-19 declaration under Section 564(b)(1) of the Act, 21 U.S.C. section 360bbb-3(b)(1), unless the authorization is terminated or revoked sooner.    Influenza A by PCR NEGATIVE NEGATIVE Final   Influenza B by PCR NEGATIVE NEGATIVE Final    Comment: (NOTE) The Xpert Xpress SARS-CoV-2/FLU/RSV assay is intended as an aid in  the diagnosis of influenza from Nasopharyngeal swab specimens and  should not be used as a sole basis for treatment. Nasal washings and  aspirates are unacceptable for Xpert Xpress SARS-CoV-2/FLU/RSV  testing. Fact Sheet for Patients: PinkCheek.be Fact Sheet for Healthcare Providers: GravelBags.it This test is not yet approved or cleared by the [place] FDA and  has been authorized for detection and/or diagnosis of SARS-CoV-2 by  FDA under an Emergency Use Authorization (EUA). This EUA will remain  in effect (meaning this test can be used) for the duration of the  Covid-19 declaration under Section 564(b)(1) of the Act, 21  U.S.C. section 360bbb-3(b)(1), unless the authorization is  terminated or revoked. Performed at [facility], [place] [address], [place], [place] [address]   Culture, blood (routine x 2)     Status: None   Collection Time: 01/21/19  1:47 AM   Specimen: BLOOD  Result Value Ref Range Status   Specimen Description BLOOD RIGHT ANTECUBITAL  Final   Special Requests   Final    BOTTLES DRAWN AEROBIC AND ANAEROBIC Blood Culture results  may not be optimal due to an inadequate volume of blood received in culture bottles   Culture   Final    NO GROWTH 5 DAYS Performed at [facility], [place] [address] [place], [place] [address]    Report  Status 01/26/2019 FINAL  Final    Radiology Studies: DG CHEST PORT 1 VIEW  Result Date: 01/27/2019 CLINICAL DATA:  Short of breath EXAM: PORTABLE CHEST 1 VIEW COMPARISON:  01/21/2019 FINDINGS: Mild airspace disease bilaterally shows interval improvement. No new area of consolidation or effusion. Central line tip in the lower SVC unchanged. IMPRESSION: Mild bilateral airspace disease with interval improvement. Electronically Signed   By: Franchot Gallo M.D.   On: 01/27/2019 07:59    Scheduled Meds: . amLODipine  5 mg Oral Daily  . Chlorhexidine Gluconate Cloth  6 each Topical Q0600  . Chlorhexidine Gluconate Cloth  6 each Topical Q0600  . Chlorhexidine Gluconate Cloth  6 each Topical Q0600  . darbepoetin (ARANESP) injection - NON-DIALYSIS  100 mcg Subcutaneous Q Wed-1800  . dexamethasone  6 mg Oral Daily  . heparin  7,500 Units Subcutaneous Q8H  . influenza vac split quadrivalent PF  0.5 mL Intramuscular Tomorrow-1000  . insulin aspart  0-9 Units Subcutaneous TID WC  . pantoprazole  40 mg Oral Q1200   Continuous Infusions: . sodium chloride    . sodium chloride       LOS: 7 days   Kerney Elbe, DO [facility] PAGER is on [place]  If 7PM-7AM, please contact night-coverage [email or web address]

## 2019-01-28 NOTE — Progress Notes (Signed)
Subjective:  Not physically seen today.  HD yesterday- removed 1000-  Also appears at least 500 of UOP -  Labs pending for today.  Review of chart does not indicate any significant events  Objective Vital signs in last 24 hours: Vitals:   01/27/19 1755 01/27/19 1959 01/28/19 0532 01/28/19 0551  BP: (!) 169/68 (!) 154/87    Pulse:  61 64   Resp:  (!) 24 (!) 22   Temp:  98.3 F (36.8 C)  98.4 F (36.9 C)  TempSrc:  Oral  Oral  SpO2:  91% (!) 86% 90%  Weight:   77 kg   Height:       Weight change:   Intake/Output Summary (Last 24 hours) at 01/28/2019 1109 Last data filed at 01/28/2019 0554 Gross per 24 hour  Intake 450 ml  Output 500 ml  Net -50 ml    Assessment/ Plan: Pt is a 64 y.o. yo female with HTN baseline renal function unknown (was 1.2 in 2009 but no more recent data) who was admitted on 01/20/2019 with  COVID and crt of 8 and BUN well over 100 Assessment/Plan: 1. Renal-  Unknown renal function baseline.  Presented on 1/10 with crt of 8 and high BUN necessitating HD. Have done HD again 1/13 and 1/15.  Seems to be nonoliguric but UOP is not well recorded.  Anticipate labs will be be better today post HD.  Will cont to monitor for renal recovery and cont to assess for HD need    2. HTN/vol-  BP normal to high-  Seems relatively euvolemic 3. Anemia- dropped early on for unclear reasons but has been stable in the 7's.  Iron was Ok- started on ESA 4. Covid-  Steroids and remdesivir- on room air    [place]: Basic Metabolic Panel: Recent Labs  Lab 01/24/19 0419 01/24/19 0419 01/25/19 0351 01/25/19 1111 01/26/19 0223 01/27/19 0201  NA 136   < > 135  --  135 134*  K 4.3   < > 4.2  --  4.0 3.9  CL 101   < > 99  --  99 97*  CO2 23   < > 19*  --  24 21*  GLUCOSE 118*   < > 146*  --  166* 166*  BUN 124*   < > 143*  --  81* 107*  CREATININE 9.37*   < > 11.24*  --  7.77* 9.12*  CALCIUM 8.0*   < > 7.8*  --  7.7* 8.0*  PHOS 11.8*  --   --  >30.0*  --   11.3*   < > = values in this interval not displayed.   Liver Function Tests: Recent Labs  Lab 01/24/19 0419 01/25/19 0351 01/27/19 0201  AST  --  20 19  ALT  --  15 20  ALKPHOS  --  29* 35*  BILITOT  --  0.6 0.7  PROT  --  4.7* 4.7*  ALBUMIN 1.3* 1.4* 1.5*   No results for input(s): LIPASE, AMYLASE in the last 168 hours. No results for input(s): AMMONIA in the last 168 hours. CBC: Recent Labs  Lab 01/22/19 2110 01/22/19 2110 01/24/19 0419 01/24/19 0419 01/25/19 0351 01/26/19 0223 01/27/19 0201  WBC 12.7*   < > 9.7   < > 10.2 10.0 13.3*  NEUTROABS  --   --  7.8*  --   --   --  10.7*  HGB 10.0*   < >  7.6*   < > 7.2* 7.8* 7.3*  HCT 29.1*   < > 22.2*   < > 21.4* 22.6* 21.4*  MCV 79.1*  --  81.9  --  81.4 81.0 81.4  PLT 418*   < > 313   < > 304 336 331   < > = values in this interval not displayed.   Cardiac Enzymes: No results for input(s): CKTOTAL, CKMB, CKMBINDEX, TROPONINI in the last 168 hours. CBG: Recent Labs  Lab 01/27/19 2232 01/28/19 0756  GLUCAP 178* 102*    Iron Studies:  Recent Labs    01/27/19 0201  FERRITIN 329*   Studies/Results: DG CHEST PORT 1 VIEW  Result Date: 01/27/2019 CLINICAL DATA:  Short of breath EXAM: PORTABLE CHEST 1 VIEW COMPARISON:  01/21/2019 FINDINGS: Mild airspace disease bilaterally shows interval improvement. No new area of consolidation or effusion. Central line tip in the lower SVC unchanged. IMPRESSION: Mild bilateral airspace disease with interval improvement. Electronically Signed   By: Franchot Gallo M.D.   On: 01/27/2019 07:59   Medications: Infusions: . sodium chloride    . sodium chloride      Scheduled Medications: . amLODipine  5 mg Oral Daily  . Chlorhexidine Gluconate Cloth  6 each Topical Q0600  . Chlorhexidine Gluconate Cloth  6 each Topical Q0600  . Chlorhexidine Gluconate Cloth  6 each Topical Q0600  . darbepoetin (ARANESP) injection - NON-DIALYSIS  100 mcg Subcutaneous Q Wed-1800  . dexamethasone  6  mg Oral Daily  . heparin  7,500 Units Subcutaneous Q8H  . influenza vac split quadrivalent PF  0.5 mL Intramuscular Tomorrow-1000  . insulin aspart  0-9 Units Subcutaneous TID WC  . pantoprazole  40 mg Oral Q1200    have reviewed scheduled and prn medications.  Physical Exam: Did not physically see today due to COVID precautions-  Visualized yesterday -  She was alert and volume status seemed fine  01/28/2019,11:09 AM  LOS: 7 days

## 2019-01-29 ENCOUNTER — Encounter (HOSPITAL_COMMUNITY): Payer: Self-pay | Admitting: Internal Medicine

## 2019-01-29 ENCOUNTER — Inpatient Hospital Stay (HOSPITAL_COMMUNITY): Payer: HRSA Program

## 2019-01-29 ENCOUNTER — Other Ambulatory Visit: Payer: Self-pay

## 2019-01-29 DIAGNOSIS — D649 Anemia, unspecified: Secondary | ICD-10-CM

## 2019-01-29 LAB — CBC WITH DIFFERENTIAL/PLATELET
Abs Immature Granulocytes: 0.75 10*3/uL — ABNORMAL HIGH (ref 0.00–0.07)
Basophils Absolute: 0 10*3/uL (ref 0.0–0.1)
Basophils Relative: 0 %
Eosinophils Absolute: 0 10*3/uL (ref 0.0–0.5)
Eosinophils Relative: 0 %
HCT: 20.5 % — ABNORMAL LOW (ref 36.0–46.0)
Hemoglobin: 6.7 g/dL — CL (ref 12.0–15.0)
Immature Granulocytes: 5 %
Lymphocytes Relative: 6 %
Lymphs Abs: 0.9 10*3/uL (ref 0.7–4.0)
MCH: 27.7 pg (ref 26.0–34.0)
MCHC: 32.7 g/dL (ref 30.0–36.0)
MCV: 84.7 fL (ref 80.0–100.0)
Monocytes Absolute: 1.1 10*3/uL — ABNORMAL HIGH (ref 0.1–1.0)
Monocytes Relative: 7 %
Neutro Abs: 13 10*3/uL — ABNORMAL HIGH (ref 1.7–7.7)
Neutrophils Relative %: 82 %
Platelets: 335 10*3/uL (ref 150–400)
RBC: 2.42 MIL/uL — ABNORMAL LOW (ref 3.87–5.11)
RDW: 14.3 % (ref 11.5–15.5)
WBC: 15.7 10*3/uL — ABNORMAL HIGH (ref 4.0–10.5)
nRBC: 0.2 % (ref 0.0–0.2)

## 2019-01-29 LAB — FIBRINOGEN: Fibrinogen: 620 mg/dL — ABNORMAL HIGH (ref 210–475)

## 2019-01-29 LAB — MAGNESIUM: Magnesium: 1.9 mg/dL (ref 1.7–2.4)

## 2019-01-29 LAB — PROCALCITONIN: Procalcitonin: 0.82 ng/mL

## 2019-01-29 LAB — COMPREHENSIVE METABOLIC PANEL
ALT: 27 U/L (ref 0–44)
AST: 19 U/L (ref 15–41)
Albumin: 1.6 g/dL — ABNORMAL LOW (ref 3.5–5.0)
Alkaline Phosphatase: 34 U/L — ABNORMAL LOW (ref 38–126)
Anion gap: 13 (ref 5–15)
BUN: 80 mg/dL — ABNORMAL HIGH (ref 8–23)
CO2: 24 mmol/L (ref 22–32)
Calcium: 7.8 mg/dL — ABNORMAL LOW (ref 8.9–10.3)
Chloride: 99 mmol/L (ref 98–111)
Creatinine, Ser: 7.36 mg/dL — ABNORMAL HIGH (ref 0.44–1.00)
GFR calc Af Amer: 6 mL/min — ABNORMAL LOW (ref >60)
GFR calc non Af Amer: 5 mL/min — ABNORMAL LOW (ref >60)
Glucose, Bld: 154 mg/dL — ABNORMAL HIGH (ref 70–99)
Potassium: 3.7 mmol/L (ref 3.5–5.1)
Sodium: 136 mmol/L (ref 135–145)
Total Bilirubin: 0.5 mg/dL (ref 0.3–1.2)
Total Protein: 4.6 g/dL — ABNORMAL LOW (ref 6.5–8.1)

## 2019-01-29 LAB — D-DIMER, QUANTITATIVE: D-Dimer, Quant: 11.36 ug/mL-FEU — ABNORMAL HIGH (ref 0.00–0.50)

## 2019-01-29 LAB — GLUCOSE, CAPILLARY
Glucose-Capillary: 112 mg/dL — ABNORMAL HIGH (ref 70–99)
Glucose-Capillary: 131 mg/dL — ABNORMAL HIGH (ref 70–99)
Glucose-Capillary: 156 mg/dL — ABNORMAL HIGH (ref 70–99)
Glucose-Capillary: 225 mg/dL — ABNORMAL HIGH (ref 70–99)

## 2019-01-29 LAB — ABO/RH: ABO/RH(D): A POS

## 2019-01-29 LAB — LACTATE DEHYDROGENASE: LDH: 256 U/L — ABNORMAL HIGH (ref 98–192)

## 2019-01-29 LAB — PHOSPHORUS: Phosphorus: 8.6 mg/dL — ABNORMAL HIGH (ref 2.5–4.6)

## 2019-01-29 LAB — FERRITIN: Ferritin: 283 ng/mL (ref 11–307)

## 2019-01-29 LAB — SEDIMENTATION RATE: Sed Rate: 50 mm/hr — ABNORMAL HIGH (ref 0–22)

## 2019-01-29 LAB — C-REACTIVE PROTEIN: CRP: 1.8 mg/dL — ABNORMAL HIGH (ref <1.0)

## 2019-01-29 MED ORDER — SODIUM CHLORIDE 0.9% IV SOLUTION: Freq: Once | INTRAVENOUS | Status: AC

## 2019-01-29 MED ORDER — CHLORHEXIDINE GLUCONATE CLOTH 2 % EX PADS
6.0000 | MEDICATED_PAD | Freq: Every day | CUTANEOUS | Status: DC
  Administered 2019-01-29 – 2019-02-05 (×8): 6 via TOPICAL

## 2019-01-29 MED ORDER — ALBUTEROL SULFATE HFA 108 (90 BASE) MCG/ACT IN AERS
1.0000 | INHALATION_SPRAY | Freq: Four times a day (QID) | RESPIRATORY_TRACT | Status: DC | PRN
  Filled 2019-01-29: qty 6.7

## 2019-01-29 NOTE — Progress Notes (Signed)
I paged Dr. Kennon Holter via text twice about the patient Tracy Escobar whose HGB WAS 6.7. I also notified Dr. Kennon Holter about her BUN and Creatinine levels.  I did not get any response back or any orders.  I did tell my charge nurse Chanisty about what I did and no response from doctor.  Will continue to monitor   and follow up with patient.

## 2019-01-29 NOTE — Progress Notes (Signed)
PROGRESS NOTE    Tracy Escobar  QHU:[id number] DOB: 04-08-1955 DOA: 01/20/2019 PCP: Patient, No Pcp Per   Brief Narrative:  PCCM transfer to [facility] 1/12, Tracy Escobar is a 63/F with no significant past medical history was brought to the emergency room on 1/9 with encephalopathy, decreased p.o. intake for a few days. -The emergency room she was noted to have COVID-19 infection with pneumonia and severe acute kidney injury with a creatinine of 8/BUN of 140 from a baseline of 1.2. -She was admitted to the ICU, underwent temporary HD catheter placement and dialysis on 1/10 -Also treated with IV remdesivir and Decadron for pneumonia -Nephrology following -Transferred to [facility] service on 1/12 -Patient imprvoing but renal function has worsened and patient to go for dialysis.  She complains of having some leg cramping during dialysis.  -On 01/28/2019 she was wearing supplemental oxygen and was slightly confused.  Repeat labs showing that inflammatory markers are trending down and WBC is elevated in the setting of steroid demargination. -Today her blood count dropped to 6.7 so will type and screen and transfuse 1 unit of PRBCs -Nephrology posting her for dialysis tomorrow pending lab work  Assessment & Plan:   Active Problems:   Renal failure   COVID-19  AKI/ATN Hyperphosphatemia -Admitted with creatinine of 8.6 and BUN of 148, felt to be secondary to Covid, and prerenal component as well as NSAID use(naproxen on med list) -No recent labs in our system, baseline unknown, last creatinine  from 2009 was 1.2 -CT without hydronephrosis -Nephrology consulting, underwent temporary HD catheter placement on 1/9 and dialysis on 1/10 -Per Nephrology did not respond to Diuretic  -Continued Foley catheter, strict I's/O's -Still not having much Urinary Output -per Renal -Monitor labs, urine output -Patient's Phos Level was >30.0 and now was 11.3 and then dropped to 7.9 is now 8.6 -BUN/Cr went from 143/11.24  -> 81/7.77 -> 107/9.12 -> 70/6.47 -> 80/7.36 -Nephrology dialyzed the day before yesterday; Patient complaining of Leg Cramping during Dialysis -Further care per nephrology and appreciate their evaluation and they are planning on doing dialysis again tomorrow pending patient's lab work as her creatinine continues to elevate  COVID-19 pneumonia -Imaging on admission consistent with COVID-19 pneumonia -S/p 5 Days of Remdesivir; Continuing Decadron and today was Day 8  -Was also getting CAP coverage in ICU, will complete this after 5 days today -Inflammatory Markers: Recent Labs    01/27/19 0201 01/28/19 0511 01/29/19 0259  DDIMER 8.51* 12.47* 11.36*  FERRITIN 329* 322* 283  LDH 290* 287* 256*  CRP 2.3* 1.6* 1.8*  -Fibrinogen was 726 and is now improved to 636 and is now 620 -PCT was 3.63 and repeat has improved to 0.82  -ESR was 105 is now improved to 100 and further improved to 50 -Because D-dimer is elevating her heparin dosing has been increased from 5000 units subcu q8h to 7,500 subcu units every 8h Lab Results  Component Value Date   SARSCOV2NAA POSITIVE (A) 01/21/2019  -SpO2: 95 % O2 Flow Rate (L/min): 2 L/min; She ended up back on 2 L of supplemental oxygen via nasal cannula yesterday and will need to wean as tolerated; desaturated down to 86% early yesterday morning; now off of supplemental oxygen via nasal cannula -C/w Antitussives with Robitussin-DM 10 mL every 4 hours as needed cough -Repeat CXR on 01/29/2019 showed "Stable bilateral lung opacities are noted concerning for multifocal pneumonia." -Airborne and Contact Precautions -C/w Antitussives with Guaifenesin-Dextromethorphan 10 mL q4hprn Cough -Will add Albuterol Inhaler prn -Repeat  chest x-ray in the a.m.  Acute metabolic Encephalopathy -Secondary to uremia, COVID-19 pneumonia -Had improved but she was little confused this morning when I went to see her but she had just woken up.  Started stuttering a little bit  and felt "foggy" -Continue to Monitor Closely  Normocytic Anemia -Hemoglobin has gradually trended and has been stable in the setting of renal disease but acutely drop and was 6.7/20.5 this morning -Patient denies any overt bleeding/melena or hematochezia, none noted by staff as well -Check anemia panel showed an iron level of 88, U IBC of 74, TIBC 162, saturation ratios of 54, ferritin level 349, folate level 9.0, and vitamin B12 level 571 -Nephrology recommending transfusing if hemoglobin drops below 7 and will give 1 unit PRBCs since her hemoglobin/hematocrit were below 7 and was 6.7/20.5 this a.m. -Continue DVT prophylaxis for now unless hemoglobin trends down further or overt bleeding noted -Patient has received ESA from nephrology as well and received darbepoetin alpha injection 100 mcg every Wednesday and last received on 01/25/2019  Hyperkalemia -Resolved with HD, renal diet -Now K+ is 3.8 -Continue to Monitor and Trend -Repeat CMP in AM   Hyperglycemia in the setting of Diabetes Mellitus Type 2 -Check Hemoglobin A1c and was 6.7 -Will add Sensitive Novolog SSI AC -CBGs ranging from 131-233 -Continue to Monitor Blood Sugars per Protocol   Obesity  -Estimated body mass index is 31.85 kg/m as calculated from the following:   Height as of this encounter: 5' 2"  (1.575 m).   Weight as of this encounter: 79 kg. -Weight Loss and Dietary Counseling   HTN -C/w Amlodipine 5 mg po Daily   Leukocytosis -Worsening in the setting of IV steroid demargination -Patient's WBC is now 14.6 and is trending up and was 15.4 this morning -Continue to monitor for signs or symptoms of infection -Repeat CBC in a.m.  DVT prophylaxis: Heparin 5,000 units sq q8h Code Status: FULL CODE  Family Communication: No family present at bedside  Disposition Plan: Pending Improvement in Renal Fxn and respiratory status  Consultants:   Nephrology  Procedures: Dialysis   Antimicrobials:   Anti-infectives (From admission, onward)   Start     Dose/Rate Route Frequency Ordered Stop   01/22/19 1000  remdesivir 100 mg in sodium chloride 0.9 % 100 mL IVPB     100 mg 200 mL/hr over 30 Minutes Intravenous Daily 01/21/19 0552 01/25/19 1900   01/21/19 0800  azithromycin (ZITHROMAX) 500 mg in sodium chloride 0.9 % 250 mL IVPB     500 mg 250 mL/hr over 60 Minutes Intravenous Every 24 hours 01/21/19 0559 01/25/19 2025   01/21/19 0630  remdesivir 200 mg in sodium chloride 0.9% 250 mL IVPB     200 mg 580 mL/hr over 30 Minutes Intravenous Once 01/21/19 0552 01/21/19 0930   01/21/19 0600  cefTRIAXone (ROCEPHIN) 1 g in sodium chloride 0.9 % 100 mL IVPB     1 g 200 mL/hr over 30 Minutes Intravenous Every 24 hours 01/21/19 0559 01/25/19 0539     Subjective: Seen and examined at bedside and she is a little somnolent but not as confused today and denies any complaints and states that she felt better.  Felt okay and states that she had an okay night.  No lightheadedness or dizziness.  No other concerns or complaints at the time.  Objective: Vitals:   01/29/19 1155 01/29/19 1210 01/29/19 1225 01/29/19 1318  BP: (!) 164/86  (!) 166/74 (!) 177/85  Pulse: 66 66 66  Resp: 18 (!) 23 18   Temp: 98.5 F (36.9 C)  98.2 F (36.8 C) 98.5 F (36.9 C)  TempSrc: Oral  Oral Oral  SpO2: 92% 96% 95%   Weight:      Height:        Intake/Output Summary (Last 24 hours) at 01/29/2019 1424 Last data filed at 01/29/2019 1320 Gross per 24 hour  Intake 360 ml  Output 950 ml  Net -590 ml   Filed Weights   01/26/19 0433 01/28/19 0532 01/29/19 0434  Weight: 78.5 kg 77 kg 79 kg   Examination: Physical Exam:  Constitutional: WN/WD obese African-American female currently in no acute distress  Eyes:  Lids and conjunctivae normal, sclerae anicteric  ENMT: External Ears, Nose appear normal. Grossly normal hearing. Mucous membranes are moist.   Neck: Appears normal, supple, no cervical masses, normal ROM,  no appreciable thyromegaly; no JVD Respiratory: Diminished to auscultation bilaterally with coarse breath sounds, no wheezing, rales, rhonchi or crackles. Normal respiratory effort and patient is not tachypenic. No accessory muscle use. Unlabored breathing and no longer wearing Supplemental O2 via [place] Cardiovascular: RRR, no murmurs / rubs / gallops. S1 and S2 auscultated. Trace extremity edema.  Abdomen: Soft, non-tender, Distended 2/2 body habitus. Bowel sounds positive x4.  GU: Deferred. Musculoskeletal: No clubbing / cyanosis of digits/nails. No joint deformity upper and lower extremities.  Skin: No rashes, lesions, ulcers on a limited skin evaluation. No induration; Warm and dry.  Neurologic: CN 2-12 grossly intact with no focal deficits.  Romberg sign and cerebellar reflexes not assessed.  Psychiatric: Normal judgment and insight. Alert and oriented x 3. Pleasant mood and appropriate affect but a little sleepy   Data Reviewed: I have personally reviewed following labs and imaging studies  CBC: Recent Labs  Lab 01/24/19 0419 01/24/19 0419 01/25/19 0351 01/26/19 0223 01/27/19 0201 01/28/19 0511 01/29/19 0259  WBC 9.7   < > 10.2 10.0 13.3* 14.6* 15.7*  NEUTROABS 7.8*  --   --   --  10.7* 11.2* 13.0*  HGB 7.6*   < > 7.2* 7.8* 7.3* 7.3* 6.7*  HCT 22.2*   < > 21.4* 22.6* 21.4* 22.1* 20.5*  MCV 81.9   < > 81.4 81.0 81.4 83.4 84.7  PLT 313   < > 304 336 331 347 335   < > = values in this interval not displayed.   Basic Metabolic Panel: Recent Labs  Lab 01/24/19 0419 01/24/19 0419 01/25/19 0351 01/25/19 1111 01/26/19 0223 01/27/19 0201 01/28/19 0511 01/29/19 0259  NA 136   < > 135  --  135 134* 137 136  K 4.3   < > 4.2  --  4.0 3.9 3.8 3.7  CL 101   < > 99  --  99 97* 98 99  CO2 23   < > 19*  --  24 21* 26 24  GLUCOSE 118*   < > 146*  --  166* 166* 98 154*  BUN 124*   < > 143*  --  81* 107* 70* 80*  CREATININE 9.37*   < > 11.24*  --  7.77* 9.12* 6.47* 7.36*  CALCIUM 8.0*    < > 7.8*  --  7.7* 8.0* 7.8* 7.8*  MG  --   --   --   --   --  2.1 1.8 1.9  PHOS 11.8*  --   --  >30.0*  --  11.3* 7.9* 8.6*   < > = values in this interval not  displayed.   GFR: Estimated Creatinine Clearance: 7.7 mL/min (A) (by C-G formula based on SCr of 7.36 mg/dL (H)). Liver Function Tests: Recent Labs  Lab 01/24/19 0419 01/25/19 0351 01/27/19 0201 01/28/19 0511 01/29/19 0259  AST  --  20 19 24 19   ALT  --  15 20 26 27   ALKPHOS  --  29* 35* 30* 34*  BILITOT  --  0.6 0.7 0.3 0.5  PROT  --  4.7* 4.7* 4.7* 4.6*  ALBUMIN 1.3* 1.4* 1.5* 1.5* 1.6*   No results for input(s): LIPASE, AMYLASE in the last 168 hours. No results for input(s): AMMONIA in the last 168 hours. Coagulation Profile: No results for input(s): INR, PROTIME in the last 168 hours. Cardiac Enzymes: No results for input(s): CKTOTAL, CKMB, CKMBINDEX, TROPONINI in the last 168 hours. BNP (last 3 results) No results for input(s): PROBNP in the last 8760 hours. HbA1C: No results for input(s): HGBA1C in the last 72 hours. CBG: Recent Labs  Lab 01/28/19 1144 01/28/19 1623 01/28/19 2114 01/29/19 0811 01/29/19 1130  GLUCAP 115* 242* 233* 112* 131*   Lipid Profile: No results for input(s): CHOL, HDL, LDLCALC, TRIG, CHOLHDL, LDLDIRECT in the last 72 hours. Thyroid Function Tests: No results for input(s): TSH, T4TOTAL, FREET4, T3FREE, THYROIDAB in the last 72 hours. Anemia Panel: Recent Labs    01/28/19 0511 01/29/19 0259  FERRITIN 322* 283   Sepsis Labs: Recent Labs  Lab 01/23/19 1454 01/29/19 0259  PROCALCITON 3.63 0.82    Recent Results (from the past 240 hour(s))  Urine culture     Status: Abnormal   Collection Time: 01/21/19 12:48 AM   Specimen: Urine, Catheterized  Result Value Ref Range Status   Specimen Description URINE, CATHETERIZED  Final   Special Requests   Final    NONE Performed at [facility], [address], [place], [place] [address]    Culture MULTIPLE SPECIES  PRESENT, SUGGEST RECOLLECTION (A)  Final   Report Status 01/22/2019 FINAL  Final  Respiratory Panel by RT PCR (Flu A&B, Covid) - Nasopharyngeal Swab     Status: Abnormal   Collection Time: 01/21/19  1:00 AM   Specimen: Nasopharyngeal Swab  Result Value Ref Range Status   SARS Coronavirus 2 by RT PCR POSITIVE (A) NEGATIVE Final    Comment: RESULT CALLED TO, READ BACK BY AND VERIFIED WITH: L VENEGAS RN 01/21/19 0225 JDW (NOTE) SARS-CoV-2 target nucleic acids are DETECTED. SARS-CoV-2 RNA is generally detectable in upper respiratory specimens  during the acute phase of infection. Positive results are indicative of the presence of the identified virus, but do not rule out bacterial infection or co-infection with other pathogens not detected by the test. Clinical correlation with patient history and other diagnostic information is necessary to determine patient infection status. The expected result is Negative. Fact Sheet for Patients:  PinkCheek.be Fact Sheet for Healthcare Providers: GravelBags.it This test is not yet approved or cleared by the [place] FDA and  has been authorized for detection and/or diagnosis of SARS-CoV-2 by FDA under an Emergency Use Authorization (EUA).  This EUA will remain in effect (meaning this test can be used) for the  duration of  the COVID-19 declaration under Section 564(b)(1) of the Act, 21 U.S.C. section 360bbb-3(b)(1), unless the authorization is terminated or revoked sooner.    Influenza A by PCR NEGATIVE NEGATIVE Final   Influenza B by PCR NEGATIVE NEGATIVE Final    Comment: (NOTE) The Xpert Xpress SARS-CoV-2/FLU/RSV assay is intended as  an aid in  the diagnosis of influenza from Nasopharyngeal swab specimens and  should not be used as a sole basis for treatment. Nasal washings and  aspirates are unacceptable for Xpert Xpress SARS-CoV-2/FLU/RSV  testing. Fact Sheet for  Patients: PinkCheek.be Fact Sheet for Healthcare Providers: GravelBags.it This test is not yet approved or cleared by the [place] FDA and  has been authorized for detection and/or diagnosis of SARS-CoV-2 by  FDA under an Emergency Use Authorization (EUA). This EUA will remain  in effect (meaning this test can be used) for the duration of the  Covid-19 declaration under Section 564(b)(1) of the Act, 21  U.S.C. section 360bbb-3(b)(1), unless the authorization is  terminated or revoked. Performed at [facility], [place] [address], [place], [place] [address]   Culture, blood (routine x 2)     Status: None   Collection Time: 01/21/19  1:47 AM   Specimen: BLOOD  Result Value Ref Range Status   Specimen Description BLOOD RIGHT ANTECUBITAL  Final   Special Requests   Final    BOTTLES DRAWN AEROBIC AND ANAEROBIC Blood Culture results may not be optimal due to an inadequate volume of blood received in culture bottles   Culture   Final    NO GROWTH 5 DAYS Performed at [facility], [place] [address], [place],  [address]    Report Status 01/26/2019 FINAL  Final    Radiology Studies: DG CHEST PORT 1 VIEW  Result Date: 01/29/2019 CLINICAL DATA:  COVID-19. EXAM: PORTABLE CHEST 1 VIEW COMPARISON:  January 27, 2019. FINDINGS: The heart size and mediastinal contours are within normal limits. No pneumothorax or pleural effusion is noted. Stable mild bilateral multiple lung opacities are noted concerning for multifocal pneumonia. The visualized skeletal structures are unremarkable. IMPRESSION: Stable bilateral lung opacities are noted concerning for multifocal pneumonia. Electronically Signed   By: Marijo Conception M.D.   On: 01/29/2019 10:00    Scheduled Meds: . amLODipine  5 mg Oral Daily  . Chlorhexidine Gluconate Cloth  6 each Topical Q0600  . Chlorhexidine Gluconate Cloth  6 each Topical Q0600  . Chlorhexidine Gluconate  Cloth  6 each Topical Q0600  . Chlorhexidine Gluconate Cloth  6 each Topical Q0600  . darbepoetin (ARANESP) injection - NON-DIALYSIS  100 mcg Subcutaneous Q Wed-1800  . dexamethasone  6 mg Oral Daily  . heparin  7,500 Units Subcutaneous Q8H  . influenza vac split quadrivalent PF  0.5 mL Intramuscular Tomorrow-1000  . insulin aspart  0-9 Units Subcutaneous TID WC  . pantoprazole  40 mg Oral Q1200   Continuous Infusions: . sodium chloride    . sodium chloride       LOS: 8 days   Kerney Elbe, DO [facility] PAGER is on [place]  If 7PM-7AM, please contact night-coverage [email or web address]

## 2019-01-29 NOTE — Progress Notes (Signed)
Subjective:  No c/o's-  hgb 6.7 this AM but had only been in the low 7's-  Going to get blood.  UOP at least 750  Objective Vital signs in last 24 hours: Vitals:   01/28/19 2032 01/29/19 0015 01/29/19 0434 01/29/19 0815  BP: (!) 154/73 (!) 157/74 (!) 132/59   Pulse: 72 64 68   Resp: (!) 25 20    Temp: 97.9 F (36.6 C) 98.4 F (36.9 C) 98.1 F (36.7 C) 97.8 F (36.6 C)  TempSrc: Oral Oral Oral Oral  SpO2: 95%     Weight:   79 kg   Height:       Weight change: 2 kg  Intake/Output Summary (Last 24 hours) at 01/29/2019 1144 Last data filed at 01/29/2019 0442 Gross per 24 hour  Intake 120 ml  Output 750 ml  Net -630 ml    Assessment/ Plan: Pt is a 64 y.o. yo female with HTN baseline renal function unknown (was 1.2 in 2009 but no more recent data) who was admitted on 01/20/2019 with  COVID and crt of 8 and BUN well over 100 Assessment/Plan: 1. Renal-  Unknown renal function baseline.  Presented on 1/10 with crt of 8 and high BUN necessitating HD. Have done HD again 1/13 and 1/15.  Seems to be nonoliguric but UOP is not well recorded. Imaging per CT does not indicate small atrophic kidneys. Still seeing increase in BUN and crt in between HD treatments.    Will put in orders for HD tomorrow but check labs in AM first to see if any signs of recovery  2. HTN/vol-  BP normal to high-  Seems relatively euvolemic 3. Anemia- dropped early on for unclear reasons but then had been stable in the 7's.  Iron was Ok- started on ESA 4. Covid-  Steroids and remdesivir- on room air    [place]: Basic Metabolic Panel: Recent Labs  Lab 01/27/19 0201 01/28/19 0511 01/29/19 0259  NA 134* 137 136  K 3.9 3.8 3.7  CL 97* 98 99  CO2 21* 26 24  GLUCOSE 166* 98 154*  BUN 107* 70* 80*  CREATININE 9.12* 6.47* 7.36*  CALCIUM 8.0* 7.8* 7.8*  PHOS 11.3* 7.9* 8.6*   Liver Function Tests: Recent Labs  Lab 01/27/19 0201 01/28/19 0511 01/29/19 0259  AST 19 24 19   ALT 20 26 27    ALKPHOS 35* 30* 34*  BILITOT 0.7 0.3 0.5  PROT 4.7* 4.7* 4.6*  ALBUMIN 1.5* 1.5* 1.6*   No results for input(s): LIPASE, AMYLASE in the last 168 hours. No results for input(s): AMMONIA in the last 168 hours. CBC: Recent Labs  Lab 01/25/19 0351 01/25/19 0351 01/26/19 0223 01/26/19 0223 01/27/19 0201 01/28/19 0511 01/29/19 0259  WBC 10.2   < > 10.0   < > 13.3* 14.6* 15.7*  NEUTROABS  --   --   --   --  10.7* 11.2* 13.0*  HGB 7.2*   < > 7.8*   < > 7.3* 7.3* 6.7*  HCT 21.4*   < > 22.6*   < > 21.4* 22.1* 20.5*  MCV 81.4  --  81.0  --  81.4 83.4 84.7  PLT 304   < > 336   < > 331 347 335   < > = values in this interval not displayed.   Cardiac Enzymes: No results for input(s): CKTOTAL, CKMB, CKMBINDEX, TROPONINI in the last 168 hours. CBG: Recent Labs  Lab 01/28/19 0756 01/28/19  1144 01/28/19 1623 01/28/19 2114 01/29/19 0811  GLUCAP 102* 115* 242* 233* 112*    Iron Studies:  Recent Labs    01/29/19 0259  FERRITIN 283   Studies/Results: DG CHEST PORT 1 VIEW  Result Date: 01/29/2019 CLINICAL DATA:  COVID-19. EXAM: PORTABLE CHEST 1 VIEW COMPARISON:  January 27, 2019. FINDINGS: The heart size and mediastinal contours are within normal limits. No pneumothorax or pleural effusion is noted. Stable mild bilateral multiple lung opacities are noted concerning for multifocal pneumonia. The visualized skeletal structures are unremarkable. IMPRESSION: Stable bilateral lung opacities are noted concerning for multifocal pneumonia. Electronically Signed   By: Marijo Conception M.D.   On: 01/29/2019 10:00   Medications: Infusions: . sodium chloride    . sodium chloride      Scheduled Medications: . sodium chloride   Intravenous Once  . amLODipine  5 mg Oral Daily  . Chlorhexidine Gluconate Cloth  6 each Topical Q0600  . Chlorhexidine Gluconate Cloth  6 each Topical Q0600  . Chlorhexidine Gluconate Cloth  6 each Topical Q0600  . darbepoetin (ARANESP) injection - NON-DIALYSIS  100  mcg Subcutaneous Q Wed-1800  . dexamethasone  6 mg Oral Daily  . heparin  7,500 Units Subcutaneous Q8H  . influenza vac split quadrivalent PF  0.5 mL Intramuscular Tomorrow-1000  . insulin aspart  0-9 Units Subcutaneous TID WC  . pantoprazole  40 mg Oral Q1200    have reviewed scheduled and prn medications.  Physical Exam: Gen-  Lying in bed-  Maybe mild distress-  hgb low- does not feel great  Right vascath placed 1/10 HEENT-  Perrla, eomi Lungs-   Ant clear CV-  RRR abd-  Soft, non tender Ext-  Min edema  01/29/2019,11:44 AM  LOS: 8 days

## 2019-01-30 LAB — FIBRINOGEN: Fibrinogen: 630 mg/dL — ABNORMAL HIGH (ref 210–475)

## 2019-01-30 LAB — COMPREHENSIVE METABOLIC PANEL
ALT: 28 U/L (ref 0–44)
AST: 19 U/L (ref 15–41)
Albumin: 1.6 g/dL — ABNORMAL LOW (ref 3.5–5.0)
Alkaline Phosphatase: 37 U/L — ABNORMAL LOW (ref 38–126)
Anion gap: 15 (ref 5–15)
BUN: 92 mg/dL — ABNORMAL HIGH (ref 8–23)
CO2: 23 mmol/L (ref 22–32)
Calcium: 8.1 mg/dL — ABNORMAL LOW (ref 8.9–10.3)
Chloride: 99 mmol/L (ref 98–111)
Creatinine, Ser: 8.22 mg/dL — ABNORMAL HIGH (ref 0.44–1.00)
GFR calc Af Amer: 5 mL/min — ABNORMAL LOW (ref >60)
GFR calc non Af Amer: 5 mL/min — ABNORMAL LOW (ref >60)
Glucose, Bld: 163 mg/dL — ABNORMAL HIGH (ref 70–99)
Potassium: 3.7 mmol/L (ref 3.5–5.1)
Sodium: 137 mmol/L (ref 135–145)
Total Bilirubin: 0.4 mg/dL (ref 0.3–1.2)
Total Protein: 5.3 g/dL — ABNORMAL LOW (ref 6.5–8.1)

## 2019-01-30 LAB — CBC WITH DIFFERENTIAL/PLATELET
Abs Immature Granulocytes: 0.81 10*3/uL — ABNORMAL HIGH (ref 0.00–0.07)
Basophils Absolute: 0 10*3/uL (ref 0.0–0.1)
Basophils Relative: 0 %
Eosinophils Absolute: 0 10*3/uL (ref 0.0–0.5)
Eosinophils Relative: 0 %
HCT: 26.5 % — ABNORMAL LOW (ref 36.0–46.0)
Hemoglobin: 8.7 g/dL — ABNORMAL LOW (ref 12.0–15.0)
Immature Granulocytes: 5 %
Lymphocytes Relative: 6 %
Lymphs Abs: 1 10*3/uL (ref 0.7–4.0)
MCH: 27.4 pg (ref 26.0–34.0)
MCHC: 32.8 g/dL (ref 30.0–36.0)
MCV: 83.6 fL (ref 80.0–100.0)
Monocytes Absolute: 1.2 10*3/uL — ABNORMAL HIGH (ref 0.1–1.0)
Monocytes Relative: 7 %
Neutro Abs: 13.8 10*3/uL — ABNORMAL HIGH (ref 1.7–7.7)
Neutrophils Relative %: 82 %
Platelets: 236 10*3/uL (ref 150–400)
RBC: 3.17 MIL/uL — ABNORMAL LOW (ref 3.87–5.11)
RDW: 16.4 % — ABNORMAL HIGH (ref 11.5–15.5)
WBC: 16.8 10*3/uL — ABNORMAL HIGH (ref 4.0–10.5)
nRBC: 0.4 % — ABNORMAL HIGH (ref 0.0–0.2)

## 2019-01-30 LAB — PHOSPHORUS: Phosphorus: 8.7 mg/dL — ABNORMAL HIGH (ref 2.5–4.6)

## 2019-01-30 LAB — TYPE AND SCREEN
ABO/RH(D): A POS
Antibody Screen: NEGATIVE
Unit division: 0

## 2019-01-30 LAB — GLUCOSE, CAPILLARY
Glucose-Capillary: 131 mg/dL — ABNORMAL HIGH (ref 70–99)
Glucose-Capillary: 150 mg/dL — ABNORMAL HIGH (ref 70–99)
Glucose-Capillary: 159 mg/dL — ABNORMAL HIGH (ref 70–99)
Glucose-Capillary: 185 mg/dL — ABNORMAL HIGH (ref 70–99)

## 2019-01-30 LAB — BPAM RBC
Blood Product Expiration Date: 202101192359
ISSUE DATE / TIME: 202101171200
Unit Type and Rh: 600

## 2019-01-30 LAB — SEDIMENTATION RATE: Sed Rate: 92 mm/hr — ABNORMAL HIGH (ref 0–22)

## 2019-01-30 LAB — D-DIMER, QUANTITATIVE: D-Dimer, Quant: 9.51 ug/mL-FEU — ABNORMAL HIGH (ref 0.00–0.50)

## 2019-01-30 LAB — FERRITIN: Ferritin: 284 ng/mL (ref 11–307)

## 2019-01-30 LAB — C-REACTIVE PROTEIN: CRP: 1.4 mg/dL — ABNORMAL HIGH (ref <1.0)

## 2019-01-30 LAB — PROCALCITONIN: Procalcitonin: 0.57 ng/mL

## 2019-01-30 LAB — LACTATE DEHYDROGENASE: LDH: 288 U/L — ABNORMAL HIGH (ref 98–192)

## 2019-01-30 LAB — MAGNESIUM: Magnesium: 2 mg/dL (ref 1.7–2.4)

## 2019-01-30 MED ORDER — HEPARIN SODIUM (PORCINE) 1000 UNIT/ML IJ SOLN
INTRAMUSCULAR | Status: AC
  Filled 2019-01-30: qty 4

## 2019-01-30 NOTE — Progress Notes (Signed)
PROGRESS NOTE    Tracy Escobar  BWI:[id number] DOB: September 10, 1955 DOA: 01/20/2019 PCP: Patient, No Pcp Per   Brief Narrative:  PCCM transfer to [facility] 1/12, Tracy Escobar is a 63/F with no significant past medical history was brought to the emergency room on 1/9 with encephalopathy, decreased p.o. intake for a few days. -The emergency room she was noted to have COVID-19 infection with pneumonia and severe acute kidney injury with a creatinine of 8/BUN of 140 from a baseline of 1.2. -She was admitted to the ICU, underwent temporary HD catheter placement and dialysis on 1/10 -Also treated with IV remdesivir and Decadron for pneumonia -Nephrology following -Transferred to [facility] service on 1/12 -Patient imprvoing but renal function has worsened and patient to go for dialysis.  She complains of having some leg cramping during dialysis.  -On 01/28/2019 she was wearing supplemental oxygen and was slightly confused.  Repeat labs showing that inflammatory markers are trending down and WBC is elevated in the setting of steroid demargination. -On 01/29/2019 her blood count dropped to 6.7 so she was typed and screened and transfused 1 unit of PRBCs -Nephrology dialyzing her on today 01/30/2019  Assessment & Plan:   Active Problems:   Renal failure   COVID-19  AKI/ATN now may be ESRD Hyperphosphatemia -Admitted with creatinine of 8.6 and BUN of 148, felt to be secondary to Covid, and prerenal component as well as NSAID use(naproxen on med list) -No recent labs in our system, baseline unknown, last creatinine  from 2009 was 1.2 -CT without hydronephrosis -Nephrology consulting, underwent temporary HD catheter placement on 1/9 and dialysis on 1/10 and again on 1/15 and 1/18 -Per Nephrology did not respond to Diuretic  -Continued Foley catheter, strict I's/O's -Still not having much Urinary Output -per Renal -Monitor labs, urine output -Patient's Phos Level was >30.0 and now is 8.7 -BUN/Cr went from  143/11.24 -> 81/7.77 -> 107/9.12 -> 70/6.47 -> 80/7.36 -> 92/8.22 -Nephrology last dialyzed on 1/15 and is currently dialyzing; Patient complaining of Leg Cramping during Dialysis but this is improved  -Further care per nephrology and appreciate their evaluation they feel that 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 and there is no clear evidence of GFR recovery as they are still seeing increase in BUN and creatinine in between HD treatments and recommend continuing to trend labs and urine output for evidence of recovered GFR  COVID-19 pneumonia -Imaging on admission consistent with COVID-19 pneumonia -S/p 5 Days of Remdesivir; Continuing Decadron and today was Day 9 -Was also getting CAP coverage in ICU, will complete this after 5 days today -Inflammatory Markers: Recent Labs    01/28/19 0511 01/29/19 0259 01/30/19 0707  DDIMER 12.47* 11.36* 9.51*  FERRITIN 322* 283 284  LDH 287* 256* 288*  CRP 1.6* 1.8* 1.4*  -Fibrinogen was 726 and is now improved to 636 and is now 620 -> 630 -PCT was 3.63 and repeat had improved to 0.82 and further improved to 0.57 -ESR was 105 is now improved to 100 and further improved to 50 -Because D-dimer is elevating her heparin dosing has been increased from 5000 units subcu q8h to 7,500 subcu units every 8h Lab Results  Component Value Date   SARSCOV2NAA POSITIVE (A) 01/21/2019  -SpO2: 93 % O2 Flow Rate (L/min): 2 L/min; She ended up back on 2 L of supplemental oxygen via nasal cannula the day bevore yesterday and will need to wean as tolerated; Intermittently wearing O2 and was on it during dialysis -C/w Antitussives with Robitussin-DM 10 mL  every 4 hours as needed cough -Repeat CXR on 01/29/2019 showed "Stable bilateral lung opacities are noted concerning for multifocal pneumonia." -Airborne and Contact Precautions -C/w Antitussives with Guaifenesin-Dextromethorphan 10 mL q4hprn Cough -Will add Albuterol Inhaler prn -Repeat chest x-ray in the a.m.  Acute  metabolic Encephalopathy -Secondary to uremia, COVID-19 pneumonia -Had improved but she was little confused this morning when I went to see her but she had just woken up.  Started stuttering a little bit and felt "foggy" -Continue to Monitor Closely  Normocytic Anemia -Hemoglobin has gradually trended and has been stable in the setting of renal disease but acutely dropped and was 6.7/20.5 yesterday morning; today after blood transfusion was 8.7/26.5 -Patient denies any overt bleeding/melena or hematochezia, none noted by staff as well -Check anemia panel showed an iron level of 88, U IBC of 74, TIBC 162, saturation ratios of 54, ferritin level 349, folate level 9.0, and vitamin B12 level 571 -Nephrology recommending transfusing if hemoglobin drops below 7 and will give 1 unit PRBCs since her hemoglobin/hematocrit were below 7 and was 6.7/20.5 this a.m. -Continue DVT prophylaxis for now unless hemoglobin trends down further or overt bleeding noted -Patient has received ESA from nephrology as well and received darbepoetin alpha injection 100 mcg every Wednesday and last received on 01/25/2019  Hyperkalemia -Resolved with HD, renal diet -Now K+ is 3.7 -Continue to Monitor and Trend -Repeat CMP in AM   Hyperglycemia in the setting of Diabetes Mellitus Type 2 -Check Hemoglobin A1c and was 6.7 -Will add Sensitive Novolog SSI AC -CBGs ranging from 112-225 -Continue to Monitor Blood Sugars per Protocol   Obesity  -Estimated body mass index is 29.03 kg/m as calculated from the following:   Height as of this encounter: _0  (1.575 m).   Weight as of this encounter: 72 kg. -Weight Loss and Dietary Counseling   HTN -C/w Amlodipine 5 mg po Daily   Leukocytosis -Worsening in the setting of IV steroid demargination -Patient's WBC is now 14.6 and is trending up and has worsened to 16.8 this morning -Continue to monitor for signs or symptoms of infection -Repeat CBC in a.m.  DVT  prophylaxis: Heparin 5,000 units sq q8h Code Status: FULL CODE  Family Communication: No family present at bedside but I updated the patient's daughter [name]) via Telephone  Disposition Plan: Pending Improvement in Renal Fxn and respiratory status  Consultants:   Nephrology  Procedures: Dialysis   Antimicrobials:  Anti-infectives (From admission, onward)   Start     Dose/Rate Route Frequency Ordered Stop   01/22/19 1000  remdesivir 100 mg in sodium chloride 0.9 % 100 mL IVPB     100 mg 200 mL/hr over 30 Minutes Intravenous Daily 01/21/19 0552 01/25/19 1900   01/21/19 0800  azithromycin (ZITHROMAX) 500 mg in sodium chloride 0.9 % 250 mL IVPB     500 mg 250 mL/hr over 60 Minutes Intravenous Every 24 hours 01/21/19 0559 01/25/19 2025   01/21/19 0630  remdesivir 200 mg in sodium chloride 0.9% 250 mL IVPB     200 mg 580 mL/hr over 30 Minutes Intravenous Once 01/21/19 0552 01/21/19 0930   01/21/19 0600  cefTRIAXone (ROCEPHIN) 1 g in sodium chloride 0.9 % 100 mL IVPB     1 g 200 mL/hr over 30 Minutes Intravenous Every 24 hours 01/21/19 0559 01/25/19 0539     Subjective: Seen and examined at bedside and she is getting dialysis and was not in any acute distress.  No nausea or vomiting.  States that she felt like she is going to have a bowel movement.  No leg pain while being dialyzed.  Seems with it to me today but daughter felt that she was a little confused earlier.  No other concerns or complaints at this time.  Objective: Vitals:   01/29/19 2000 01/29/19 2344 01/30/19 0400 01/30/19 0803  BP: (!) 170/78 (!) 155/76 (!) 157/78   Pulse: 71 62 (!) 58   Resp: 20 (!) 22 (!) 23   Temp: 98.7 F (37.1 C) 98.9 F (37.2 C) 98.1 F (36.7 C) 97.9 F (36.6 C)  TempSrc: Oral Axillary Oral Oral  SpO2: 94% 93% 93%   Weight:   72 kg   Height:        Intake/Output Summary (Last 24 hours) at 01/30/2019 0840 Last data filed at 01/29/2019 1500 Gross per 24 hour  Intake 560 ml  Output 200 ml    Net 360 ml   Filed Weights   01/28/19 0532 01/29/19 0434 01/30/19 0400  Weight: 77 kg 79 kg 72 kg   Examination: Physical Exam:  Constitutional: WN/WD overweight African-American female currently in NAD and appears calm while she is currently being dialyzed Eyes: Lids and conjunctivae normal, sclerae anicteric  ENMT: External Ears, Nose appear normal. Grossly normal hearing. Mucous membranes are moist. Neck: Appears normal, supple, no cervical masses, normal ROM, no appreciable thyromegaly; no JVD Respiratory: Diminished to auscultation bilaterally with coarse breath sounds, no wheezing, rales, rhonchi or crackles. Normal respiratory effort and patient is not tachypenic. No accessory muscle use.  Her breathing but she is wearing 2 L of supplemental oxygen via nasal cannula while she is being dialyzed Cardiovascular: RRR, no murmurs / rubs / gallops. S1 and S2 auscultated.  Mild extremity edema. Abdomen: Soft, non-tender, distended secondary to body habitus. Bowel sounds positive.  GU: Deferred. Musculoskeletal: No clubbing / cyanosis of digits/nails. No joint deformity upper and lower extremities.  Skin: No rashes, lesions, ulcers on limited skin evaluation. No induration; Warm and dry.  Neurologic: CN 2-12 grossly intact with no focal deficits. Romberg sign and cerebellar reflexes not assessed.  Psychiatric: Normal judgment and insight. Alert and oriented x 3. Normal mood and appropriate affect.    Data Reviewed: I have personally reviewed following labs and imaging studies  CBC: Recent Labs  Lab 01/24/19 0419 01/25/19 0351 01/26/19 0223 01/27/19 0201 01/28/19 0511 01/29/19 0259 01/30/19 0707  WBC 9.7   < > 10.0 13.3* 14.6* 15.7* 16.8*  NEUTROABS 7.8*  --   --  10.7* 11.2* 13.0* 13.8*  HGB 7.6*   < > 7.8* 7.3* 7.3* 6.7* 8.7*  HCT 22.2*   < > 22.6* 21.4* 22.1* 20.5* 26.5*  MCV 81.9   < > 81.0 81.4 83.4 84.7 83.6  PLT 313   < > 336 331 347 335 236   < > = values in this  interval not displayed.   Basic Metabolic Panel: Recent Labs  Lab 01/25/19 0351 01/25/19 1111 01/26/19 0223 01/27/19 0201 01/28/19 0511 01/29/19 0259 01/30/19 0707  NA   < >  --  135 134* 137 136 137  K   < >  --  4.0 3.9 3.8 3.7 3.7  CL   < >  --  99 97* 98 99 99  CO2   < >  --  24 21* _0 GLUCOSE   < >  --  166* 166* 98 154* 163*  BUN   < >  --  81*  107* 70* 80* 92*  CREATININE   < >  --  7.77* 9.12* 6.47* 7.36* 8.22*  CALCIUM   < >  --  7.7* 8.0* 7.8* 7.8* 8.1*  MG  --   --   --  2.1 1.8 1.9 2.0  PHOS  --  >30.0*  --  11.3* 7.9* 8.6* 8.7*   < > = values in this interval not displayed.   GFR: Estimated Creatinine Clearance: 6.6 mL/min (A) (by C-G formula based on SCr of 8.22 mg/dL (H)). Liver Function Tests: Recent Labs  Lab 01/25/19 0351 01/27/19 0201 01/28/19 0511 01/29/19 0259 01/30/19 0707  AST _0 ALT _1 ALKPHOS 29* 35* 30* 34* 37*  BILITOT 0.6 0.7 0.3 0.5 0.4  PROT 4.7* 4.7* 4.7* 4.6* 5.3*  ALBUMIN 1.4* 1.5* 1.5* 1.6* 1.6*   No results for input(s): LIPASE, AMYLASE in the last 168 hours. No results for input(s): AMMONIA in the last 168 hours. Coagulation Profile: No results for input(s): INR, PROTIME in the last 168 hours. Cardiac Enzymes: No results for input(s): CKTOTAL, CKMB, CKMBINDEX, TROPONINI in the last 168 hours. BNP (last 3 results) No results for input(s): PROBNP in the last 8760 hours. HbA1C: No results for input(s): HGBA1C in the last 72 hours. CBG: Recent Labs  Lab 01/29/19 0811 01/29/19 1130 01/29/19 1618 01/29/19 2040 01/30/19 0801  GLUCAP 112* 131* 156* 225* 150*   Lipid Profile: No results for input(s): CHOL, HDL, LDLCALC, TRIG, CHOLHDL, LDLDIRECT in the last 72 hours. Thyroid Function Tests: No results for input(s): TSH, T4TOTAL, FREET4, T3FREE, THYROIDAB in the last 72 hours. Anemia Panel: Recent Labs    01/29/19 0259 01/30/19 0707  FERRITIN 283 284   Sepsis Labs: Recent Labs  Lab  01/23/19 1454 01/29/19 0259  PROCALCITON 3.63 0.82    Recent Results (from the past 240 hour(s))  Urine culture     Status: Abnormal   Collection Time: 01/21/19 12:48 AM   Specimen: Urine, Catheterized  Result Value Ref Range Status   Specimen Description URINE, CATHETERIZED  Final   Special Requests   Final    NONE Performed at [facility], [address], [place], [place] [address]    Culture MULTIPLE SPECIES PRESENT, SUGGEST RECOLLECTION (A)  Final   Report Status 01/22/2019 FINAL  Final  Respiratory Panel by RT PCR (Flu A&B, Covid) - Nasopharyngeal Swab     Status: Abnormal   Collection Time: 01/21/19  1:00 AM   Specimen: Nasopharyngeal Swab  Result Value Ref Range Status   SARS Coronavirus 2 by RT PCR POSITIVE (A) NEGATIVE Final    Comment: RESULT CALLED TO, READ BACK BY AND VERIFIED WITH: L VENEGAS RN 01/21/19 0225 JDW (NOTE) SARS-CoV-2 target nucleic acids are DETECTED. SARS-CoV-2 RNA is generally detectable in upper respiratory specimens  during the acute phase of infection. Positive results are indicative of the presence of the identified virus, but do not rule out bacterial infection or co-infection with other pathogens not detected by the test. Clinical correlation with patient history and other diagnostic information is necessary to determine patient infection status. The expected result is Negative. Fact Sheet for Patients:  PinkCheek.be Fact Sheet for Healthcare Providers: GravelBags.it This test is not yet approved or cleared by the [place] FDA and  has been authorized for detection and/or diagnosis of SARS-CoV-2 by FDA under an Emergency Use Authorization (EUA).  This EUA will remain in effect (meaning this test can be  used) for the  duration of  the COVID-19 declaration under Section 564(b)(1) of the Act, 21 U.S.C. section 360bbb-3(b)(1), unless the authorization is terminated or revoked  sooner.    Influenza A by PCR NEGATIVE NEGATIVE Final   Influenza B by PCR NEGATIVE NEGATIVE Final    Comment: (NOTE) The Xpert Xpress SARS-CoV-2/FLU/RSV assay is intended as an aid in  the diagnosis of influenza from Nasopharyngeal swab specimens and  should not be used as a sole basis for treatment. Nasal washings and  aspirates are unacceptable for Xpert Xpress SARS-CoV-2/FLU/RSV  testing. Fact Sheet for Patients: PinkCheek.be Fact Sheet for Healthcare Providers: GravelBags.it This test is not yet approved or cleared by the [place] FDA and  has been authorized for detection and/or diagnosis of SARS-CoV-2 by  FDA under an Emergency Use Authorization (EUA). This EUA will remain  in effect (meaning this test can be used) for the duration of the  Covid-19 declaration under Section 564(b)(1) of the Act, 21  U.S.C. section 360bbb-3(b)(1), unless the authorization is  terminated or revoked. Performed at [facility], [place] [address], [place], [place] [address]   Culture, blood (routine x 2)     Status: None   Collection Time: 01/21/19  1:47 AM   Specimen: BLOOD  Result Value Ref Range Status   Specimen Description BLOOD RIGHT ANTECUBITAL  Final   Special Requests   Final    BOTTLES DRAWN AEROBIC AND ANAEROBIC Blood Culture results may not be optimal due to an inadequate volume of blood received in culture bottles   Culture   Final    NO GROWTH 5 DAYS Performed at [facility], [place] [address], [place], [place] [address]    Report Status 01/26/2019 FINAL  Final    Radiology Studies: DG CHEST PORT 1 VIEW  Result Date: 01/29/2019 CLINICAL DATA:  COVID-19. EXAM: PORTABLE CHEST 1 VIEW COMPARISON:  January 27, 2019. FINDINGS: The heart size and mediastinal contours are within normal limits. No pneumothorax or pleural effusion is noted. Stable mild bilateral multiple lung opacities are noted concerning for  multifocal pneumonia. The visualized skeletal structures are unremarkable. IMPRESSION: Stable bilateral lung opacities are noted concerning for multifocal pneumonia. Electronically Signed   By: Marijo Conception M.D.   On: 01/29/2019 10:00    Scheduled Meds: . amLODipine  5 mg Oral Daily  . Chlorhexidine Gluconate Cloth  6 each Topical Q0600  . Chlorhexidine Gluconate Cloth  6 each Topical Q0600  . Chlorhexidine Gluconate Cloth  6 each Topical Q0600  . Chlorhexidine Gluconate Cloth  6 each Topical Q0600  . darbepoetin (ARANESP) injection - NON-DIALYSIS  100 mcg Subcutaneous Q Wed-1800  . dexamethasone  6 mg Oral Daily  . heparin      . heparin  7,500 Units Subcutaneous Q8H  . influenza vac split quadrivalent PF  0.5 mL Intramuscular Tomorrow-1000  . insulin aspart  0-9 Units Subcutaneous TID WC  . pantoprazole  40 mg Oral Q1200   Continuous Infusions: . sodium chloride    . sodium chloride       LOS: 9 days   Kerney Elbe, DO [facility] PAGER is on [place]  If 7PM-7AM, please contact night-coverage [email or web address]

## 2019-01-30 NOTE — Progress Notes (Signed)
Subjective:    HD today, 1L UF  0.2L UOP reported yesterday  Pt w/o complaint  intradialytic change in SCr not suggestive of GFR recovery  Objective Vital signs in last 24 hours: Vitals:   01/30/19 1130 01/30/19 1200 01/30/19 1230 01/30/19 1248  BP: (!) 147/80 (!) 146/80 140/78 (!) 146/82  Pulse: 68 70 66 68  Resp: 16 16 16 18   Temp:    98 F (36.7 C)  TempSrc:    Oral  SpO2: 100% 100% 100% 100%  Weight:    71 kg  Height:       Weight change: -7 kg  Intake/Output Summary (Last 24 hours) at 01/30/2019 1440 Last data filed at 01/30/2019 1248 Gross per 24 hour  Intake 320 ml  Output 1000 ml  Net -680 ml    Assessment/ Plan: Pt is a 64 y.o. yo female with HTN baseline renal function unknown (was 1.2 in 2009 but no more recent data) who was admitted on 01/20/2019 with  COVID and crt of 8 and BUN well over 100 Assessment/Plan: 1. Renal-  Unknown renal function baseline.  Presented on 1/10 with crt of 8 and high BUN necessitating HD.  Dialysis dep't and no clear evidence of GFR recovery. Might be ESRD.    Still seeing increase in BUN and crt in between HD treatments.    Cont to trend labs/uop for evidence of recovered GFR 2. HTN/vol-  BP normal to high-  Seems relatively euvolemic 3. Anemia- Cont ESA, transfuse prn 4. Covid-  Steroids and remdesivir- n room air per [facility]  Jacobey Gura B Byran Bilotti    Labs: Basic Metabolic Panel: Recent Labs  Lab 01/28/19 0511 01/29/19 0259 01/30/19 0707  NA 137 136 137  K 3.8 3.7 3.7  CL 98 99 99  CO2 26 24 23   GLUCOSE 98 154* 163*  BUN 70* 80* 92*  CREATININE 6.47* 7.36* 8.22*  CALCIUM 7.8* 7.8* 8.1*  PHOS 7.9* 8.6* 8.7*   Liver Function Tests: Recent Labs  Lab 01/28/19 0511 01/29/19 0259 01/30/19 0707  AST 24 19 19   ALT 26 27 28   ALKPHOS 30* 34* 37*  BILITOT 0.3 0.5 0.4  PROT 4.7* 4.6* 5.3*  ALBUMIN 1.5* 1.6* 1.6*   No results for input(s): LIPASE, AMYLASE in the last 168 hours. No results for input(s): AMMONIA in the last 168  hours. CBC: Recent Labs  Lab 01/26/19 0223 01/26/19 0223 01/27/19 0201 01/27/19 0201 01/28/19 0511 01/29/19 0259 01/30/19 0707  WBC 10.0   < > 13.3*   < > 14.6* 15.7* 16.8*  NEUTROABS  --   --  10.7*   < > 11.2* 13.0* 13.8*  HGB 7.8*   < > 7.3*   < > 7.3* 6.7* 8.7*  HCT 22.6*   < > 21.4*   < > 22.1* 20.5* 26.5*  MCV 81.0  --  81.4  --  83.4 84.7 83.6  PLT 336   < > 331   < > 347 335 236   < > = values in this interval not displayed.   Cardiac Enzymes: No results for input(s): CKTOTAL, CKMB, CKMBINDEX, TROPONINI in the last 168 hours. CBG: Recent Labs  Lab 01/29/19 1130 01/29/19 1315 01/29/19 1618 01/29/19 2040 01/30/19 0801  GLUCAP 131* 131* 156* 225* 150*    Iron Studies:  Recent Labs    01/30/19 0707  FERRITIN 284   Studies/Results: DG CHEST PORT 1 VIEW  Result Date: 01/29/2019 CLINICAL DATA:  COVID-19. EXAM: PORTABLE CHEST 1 VIEW  COMPARISON:  January 27, 2019. FINDINGS: The heart size and mediastinal contours are within normal limits. No pneumothorax or pleural effusion is noted. Stable mild bilateral multiple lung opacities are noted concerning for multifocal pneumonia. The visualized skeletal structures are unremarkable. IMPRESSION: Stable bilateral lung opacities are noted concerning for multifocal pneumonia. Electronically Signed   By: Marijo Conception M.D.   On: 01/29/2019 10:00   Medications: Infusions: . sodium chloride    . sodium chloride      Scheduled Medications: . amLODipine  5 mg Oral Daily  . Chlorhexidine Gluconate Cloth  6 each Topical Q0600  . darbepoetin (ARANESP) injection - NON-DIALYSIS  100 mcg Subcutaneous Q Wed-1800  . dexamethasone  6 mg Oral Daily  . heparin  7,500 Units Subcutaneous Q8H  . influenza vac split quadrivalent PF  0.5 mL Intramuscular Tomorrow-1000  . insulin aspart  0-9 Units Subcutaneous TID WC  . pantoprazole  40 mg Oral Q1200    have reviewed scheduled and prn medications.  Physical Exam: Gen-  NAD,  pleasant HEENT-  eomi Lungs-   Ant clear CV-  RRR abd-  Soft, non tender Ext-  No sig edema  01/30/2019,2:40 PM  LOS: 9 days

## 2019-01-31 LAB — MAGNESIUM: Magnesium: 1.7 mg/dL (ref 1.7–2.4)

## 2019-01-31 LAB — CBC WITH DIFFERENTIAL/PLATELET
Abs Immature Granulocytes: 0.48 10*3/uL — ABNORMAL HIGH (ref 0.00–0.07)
Basophils Absolute: 0 10*3/uL (ref 0.0–0.1)
Basophils Relative: 0 %
Eosinophils Absolute: 0 10*3/uL (ref 0.0–0.5)
Eosinophils Relative: 0 %
HCT: 25.2 % — ABNORMAL LOW (ref 36.0–46.0)
Hemoglobin: 8.2 g/dL — ABNORMAL LOW (ref 12.0–15.0)
Immature Granulocytes: 4 %
Lymphocytes Relative: 11 %
Lymphs Abs: 1.5 10*3/uL (ref 0.7–4.0)
MCH: 27.9 pg (ref 26.0–34.0)
MCHC: 32.5 g/dL (ref 30.0–36.0)
MCV: 85.7 fL (ref 80.0–100.0)
Monocytes Absolute: 1.4 10*3/uL — ABNORMAL HIGH (ref 0.1–1.0)
Monocytes Relative: 10 %
Neutro Abs: 10.1 10*3/uL — ABNORMAL HIGH (ref 1.7–7.7)
Neutrophils Relative %: 75 %
Platelets: 323 10*3/uL (ref 150–400)
RBC: 2.94 MIL/uL — ABNORMAL LOW (ref 3.87–5.11)
RDW: 16.7 % — ABNORMAL HIGH (ref 11.5–15.5)
WBC: 13.5 10*3/uL — ABNORMAL HIGH (ref 4.0–10.5)
nRBC: 0.3 % — ABNORMAL HIGH (ref 0.0–0.2)

## 2019-01-31 LAB — COMPREHENSIVE METABOLIC PANEL
ALT: 27 U/L (ref 0–44)
AST: 17 U/L (ref 15–41)
Albumin: 1.5 g/dL — ABNORMAL LOW (ref 3.5–5.0)
Alkaline Phosphatase: 33 U/L — ABNORMAL LOW (ref 38–126)
Anion gap: 9 (ref 5–15)
BUN: 47 mg/dL — ABNORMAL HIGH (ref 8–23)
CO2: 27 mmol/L (ref 22–32)
Calcium: 7.7 mg/dL — ABNORMAL LOW (ref 8.9–10.3)
Chloride: 105 mmol/L (ref 98–111)
Creatinine, Ser: 5.02 mg/dL — ABNORMAL HIGH (ref 0.44–1.00)
GFR calc Af Amer: 10 mL/min — ABNORMAL LOW (ref >60)
GFR calc non Af Amer: 9 mL/min — ABNORMAL LOW (ref >60)
Glucose, Bld: 99 mg/dL (ref 70–99)
Potassium: 3.4 mmol/L — ABNORMAL LOW (ref 3.5–5.1)
Sodium: 141 mmol/L (ref 135–145)
Total Bilirubin: 0.3 mg/dL (ref 0.3–1.2)
Total Protein: 4.7 g/dL — ABNORMAL LOW (ref 6.5–8.1)

## 2019-01-31 LAB — C-REACTIVE PROTEIN: CRP: 0.9 mg/dL (ref <1.0)

## 2019-01-31 LAB — SEDIMENTATION RATE: Sed Rate: 82 mm/hr — ABNORMAL HIGH (ref 0–22)

## 2019-01-31 LAB — FIBRINOGEN: Fibrinogen: 622 mg/dL — ABNORMAL HIGH (ref 210–475)

## 2019-01-31 LAB — GLUCOSE, CAPILLARY
Glucose-Capillary: 98 mg/dL (ref 70–99)
Glucose-Capillary: 115 mg/dL — ABNORMAL HIGH (ref 70–99)
Glucose-Capillary: 249 mg/dL — ABNORMAL HIGH (ref 70–99)

## 2019-01-31 LAB — LACTATE DEHYDROGENASE: LDH: 230 U/L — ABNORMAL HIGH (ref 98–192)

## 2019-01-31 LAB — FERRITIN: Ferritin: 204 ng/mL (ref 11–307)

## 2019-01-31 LAB — D-DIMER, QUANTITATIVE: D-Dimer, Quant: 10.21 ug/mL-FEU — ABNORMAL HIGH (ref 0.00–0.50)

## 2019-01-31 LAB — PHOSPHORUS: Phosphorus: 6.1 mg/dL — ABNORMAL HIGH (ref 2.5–4.6)

## 2019-01-31 MED ORDER — POTASSIUM CHLORIDE CRYS ER 20 MEQ PO TBCR
40.0000 meq | EXTENDED_RELEASE_TABLET | Freq: Once | ORAL | Status: AC
  Administered 2019-01-31: 13:00:00 40 meq via ORAL
  Filled 2019-01-31: qty 2

## 2019-01-31 NOTE — Progress Notes (Signed)
Subjective:    No interval events, she tells me she intends to return to the [place] area upon discharge  Currently using a temporary HD catheter  Afebrile, off antibiotics  1.0 L UOP reported yesterday  Pt w/o complaint   Objective Vital signs in last 24 hours: Vitals:   01/31/19 0308 01/31/19 0650 01/31/19 0900 01/31/19 1300  BP: 133/74  (!) 143/70   Pulse:   (!) 57 67  Resp:   (!) 23 (!) 22  Temp: 97.8 F (36.6 C)  97.6 F (36.4 C) 97.8 F (36.6 C)  TempSrc: Oral  Oral Oral  SpO2: 100%  97% 97%  Weight:  79.3 kg    Height:       Weight change: 0 kg  Intake/Output Summary (Last 24 hours) at 01/31/2019 1439 Last data filed at 01/31/2019 1300 Gross per 24 hour  Intake 240 ml  Output 1350 ml  Net -1110 ml    Assessment/ Plan: Pt is a 64 y.o. yo female with HTN baseline renal function unknown (was 1.2 in 2009 but no more recent data) who was admitted on 01/20/2019 with  COVID and crt of 8 and BUN well over 100 Assessment/Plan: 1. Renal-  Unknown renal function baseline.  Presented on 1/10 with crt of 8 and high BUN necessitating HD.  Dialysis dep't and no clear evidence of GFR recovery. Might be ESRD.    Still seeing increase in BUN and crt in between HD treatments.    Will probably need to begin clip process, perhaps as an AKI.  Also will need to convert to a tunneled catheter.  We will follow up labs and UOP tomorrow and if no clear evidence of GFR recovery begin this plan.   2. HTN/vol-  BP normal to high-  Seems relatively euvolemic 3. Anemia- Cont ESA, transfuse prn 4. Covid-  Steroids and has finished remdesivir- on room air per [place]: Basic Metabolic Panel: Recent Labs  Lab 01/29/19 0259 01/30/19 0707 01/31/19 0653  NA 136 137 141  K 3.7 3.7 3.4*  CL 99 99 105  CO2 24 23 27   GLUCOSE 154* 163* 99  BUN 80* 92* 47*  CREATININE 7.36* 8.22* 5.02*  CALCIUM 7.8* 8.1* 7.7*  PHOS 8.6* 8.7* 6.1*   Liver Function Tests: Recent  Labs  Lab 01/29/19 0259 01/30/19 0707 01/31/19 0653  AST 19 19 17   ALT 27 28 27   ALKPHOS 34* 37* 33*  BILITOT 0.5 0.4 0.3  PROT 4.6* 5.3* 4.7*  ALBUMIN 1.6* 1.6* 1.5*   No results for input(s): LIPASE, AMYLASE in the last 168 hours. No results for input(s): AMMONIA in the last 168 hours. CBC: Recent Labs  Lab 01/27/19 0201 01/27/19 0201 01/28/19 0511 01/28/19 0511 01/29/19 0259 01/30/19 0707 01/31/19 0653  WBC 13.3*   < > 14.6*   < > 15.7* 16.8* 13.5*  NEUTROABS 10.7*   < > 11.2*   < > 13.0* 13.8* 10.1*  HGB 7.3*   < > 7.3*   < > 6.7* 8.7* 8.2*  HCT 21.4*   < > 22.1*   < > 20.5* 26.5* 25.2*  MCV 81.4  --  83.4  --  84.7 83.6 85.7  PLT 331   < > 347   < > 335 236 323   < > = values in this interval not displayed.   Cardiac Enzymes: No results for input(s): CKTOTAL, CKMB, CKMBINDEX, TROPONINI in the last 168 hours. CBG: Recent  Labs  Lab 01/30/19 0801 01/30/19 1655 01/30/19 2041 01/31/19 0803 01/31/19 1222  GLUCAP 150* 159* 185* 98 115*    Iron Studies:  Recent Labs    01/31/19 0653  FERRITIN 204   Studies/Results: No results found. Medications: Infusions: . sodium chloride    . sodium chloride      Scheduled Medications: . amLODipine  5 mg Oral Daily  . Chlorhexidine Gluconate Cloth  6 each Topical Q0600  . darbepoetin (ARANESP) injection - NON-DIALYSIS  100 mcg Subcutaneous Q Wed-1800  . dexamethasone  6 mg Oral Daily  . heparin  7,500 Units Subcutaneous Q8H  . influenza vac split quadrivalent PF  0.5 mL Intramuscular Tomorrow-1000  . insulin aspart  0-9 Units Subcutaneous TID WC  . pantoprazole  40 mg Oral Q1200    have reviewed scheduled and prn medications.  Physical Exam: Gen-  NAD, pleasant HEENT-  eomi Lungs-   Ant clear CV-  RRR abd-  Soft, non tender Ext-  No sig edema  01/31/2019,2:39 PM  LOS: 10 days

## 2019-01-31 NOTE — Progress Notes (Signed)
PROGRESS NOTE    Tracy Escobar  JJK:[id number] DOB: Feb 26, 1955 DOA: 01/20/2019 PCP: Patient, No Pcp Per   Brief Narrative:  PCCM transfer to [facility] 1/12, Tracy Escobar is a 63/F with no significant past medical history was brought to the emergency room on 1/9 with encephalopathy, decreased p.o. intake for a few days. -The emergency room she was noted to have COVID-19 infection with pneumonia and severe acute kidney injury with a creatinine of 8/BUN of 140 from a baseline of 1.2. -She was admitted to the ICU, underwent temporary HD catheter placement and dialysis on 1/10 -Also treated with IV remdesivir and Decadron for pneumonia -Nephrology following -Transferred to [facility] service on 1/12 -Patient imprvoing but renal function has worsened and patient to go for dialysis.  She complains of having some leg cramping during dialysis.  -On 01/28/2019 she was wearing supplemental oxygen and was slightly confused.  Repeat labs showing that inflammatory markers are trending down and WBC is elevated in the setting of steroid demargination. -On 01/29/2019 her blood count dropped to 6.7 so she was typed and screened and transfused 1 unit of PRBCs -Nephrology dialyzed 01/30/2019 -On 01/31/2019 back on 2 Liters of O2  Assessment & Plan:   Active Problems:   Renal failure   COVID-19  AKI/ATN now may be ESRD Hyperphosphatemia -Admitted with creatinine of 8.6 and BUN of 148, felt to be secondary to Covid, and prerenal component as well as NSAID use(naproxen on med list) -No recent labs in our system, baseline unknown, last creatinine  from 2009 was 1.2 -CT without hydronephrosis -Nephrology consulting, underwent temporary HD catheter placement on 1/9 and dialysis on 1/10 and again on 1/15 and 1/18 -Per Nephrology did not respond to Diuretic  -Continued Foley catheter, strict I's/O's -Still not having much Urinary Output -per Renal -Monitor labs, urine output -Patient's Phos Level was >30.0 and now is  6.1 -BUN/Cr went from 143/11.24 -> 81/7.77 -> 107/9.12 -> 70/6.47 -> 80/7.36 -> 92/8.22 -> 47/5.02 afer  -Nephrology last dialyzed on 1/15 and on 1/18; Patient complaining of Leg Cramping during Dialysis but this is improved  -Further care per nephrology and appreciate their evaluation they feel that 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 and there is no clear evidence of GFR recovery as they are still seeing increase in BUN and creatinine in between HD treatments and recommend continuing to trend labs and urine output for evidence of recovered GFR  COVID-19 pneumonia -Imaging on admission consistent with COVID-19 pneumonia -S/p 5 Days of Remdesivir; Continuing Decadron and today was Day 10 -Was also getting CAP coverage in ICU and this was complete after 5 days -Inflammatory Markers: Recent Labs    01/29/19 0259 01/30/19 0707 01/31/19 0653  DDIMER 11.36* 9.51* 10.21*  FERRITIN 283 284 204  LDH 256* 288* 230*  CRP 1.8* 1.4* 0.9  -Fibrinogen was 726 and is now 622 -PCT was 3.63 and repeat had improved to 0.82 and further improved to 0.57 -ESR is now 82 -Because D-dimer is elevating her heparin dosing has been increased from 5000 units subcu q8h to 7,500 subcu units every 8h Lab Results  Component Value Date   SARSCOV2NAA POSITIVE (A) 01/21/2019  -SpO2: 97 % O2 Flow Rate (L/min): 2 L/min; intermittently on oxygen and was wearing it yesterday during dialysis and is back on it again today -C/w Antitussives with Robitussin-DM 10 mL every 4 hours as needed cough -Repeat CXR on 01/29/2019 showed "Stable bilateral lung opacities are noted concerning for multifocal pneumonia." -Airborne and Contact Precautions -  C/w Antitussives with Guaifenesin-Dextromethorphan 10 mL q4hprn Cough -Will add Albuterol Inhaler prn -Repeat chest x-ray in the a.m. -Will need an ambulatory home O2 screen prior to discharge  Acute metabolic Encephalopathy -Secondary to uremia, COVID-19 pneumonia -Had intermittent  confusion but this seems to have improved -Continue to Monitor Closely  Normocytic Anemia -Hemoglobin has gradually trended and has been stable in the setting of renal disease but acutely dropped and was 6.7/20.5 a few days ago; she received 1 unit of PRBCs after blood transfusion was 8.7/26.5 and today it is 8.2/25.2 -Patient denies any overt bleeding/melena or hematochezia, none noted by staff as well -Check anemia panel showed an iron level of 88, U IBC of 74, TIBC 162, saturation ratios of 54, ferritin level 349, folate level 9.0, and vitamin B12 level 571 -Nephrology recommending transfusing if hemoglobin drops below 7 and will give 1 unit PRBCs since her hemoglobin/hematocrit were below 7 and was 6.7/20.5 this a.m. -Continue DVT prophylaxis for now unless hemoglobin trends down further or overt bleeding noted -Patient has received ESA from nephrology as well and received darbepoetin alpha injection 100 mcg every Wednesday and last received on 01/25/2019 -Continue to monitor for signs and symptoms of bleeding -Pete CBC in a.m.  Hyperkalemia -> Hypokalemia  -Resolved with HD, renal diet -Now K+ is 3.4; will replete with p.o. potassium chloride 40 mEq x 1 -Continue to Monitor and Trend -Repeat CMP in AM   Hyperglycemia in the setting of Diabetes Mellitus Type 2 -Check Hemoglobin A1c and was 6.7 -Will add Sensitive Novolog SSI AC -CBGs ranging from 98-185 -Continue to Monitor Blood Sugars per Protocol   Obesity  -Estimated body mass index is 31.98 kg/m as calculated from the following:   Height as of this encounter: 5' 2"  (1.575 m).   Weight as of this encounter: 79.3 kg. -Weight Loss and Dietary Counseling   HTN -C/w Amlodipine 5 mg po Daily   Leukocytosis -Worsening in the setting of IV steroid demargination -Patient's WBC is now improving 13.5 -Continue to monitor for signs or symptoms of infection -Repeat CBC in a.m.  DVT prophylaxis: Heparin 5,000 units sq q8h Code  Status: FULL CODE  Family Communication: No family present at bedside but I updated the patient's daughter Tracy Escobar) via Telephone yesterday  Disposition Plan: Pending Improvement in Renal Fxn and respiratory status  Consultants:   Nephrology  Procedures: Dialysis   Antimicrobials:  Anti-infectives (From admission, onward)   Start     Dose/Rate Route Frequency Ordered Stop   01/22/19 1000  remdesivir 100 mg in sodium chloride 0.9 % 100 mL IVPB     100 mg 200 mL/hr over 30 Minutes Intravenous Daily 01/21/19 0552 01/25/19 1900   01/21/19 0800  azithromycin (ZITHROMAX) 500 mg in sodium chloride 0.9 % 250 mL IVPB     500 mg 250 mL/hr over 60 Minutes Intravenous Every 24 hours 01/21/19 0559 01/25/19 2025   01/21/19 0630  remdesivir 200 mg in sodium chloride 0.9% 250 mL IVPB     200 mg 580 mL/hr over 30 Minutes Intravenous Once 01/21/19 0552 01/21/19 0930   01/21/19 0600  cefTRIAXone (ROCEPHIN) 1 g in sodium chloride 0.9 % 100 mL IVPB     1 g 200 mL/hr over 30 Minutes Intravenous Every 24 hours 01/21/19 0559 01/25/19 0539     Subjective: Seen and examined at bedside she ended up back on oxygen last night.  No nausea or vomiting.  States that she feels okay.  Has not had a  bowel movement yet today.  No other concerns or points at this time and feels okay but happy that her creatinine is improved.  Objective: Vitals:   01/31/19 0308 01/31/19 0650 01/31/19 0900 01/31/19 1300  BP: 133/74  (!) 143/70   Pulse:   (!) 57 67  Resp:   (!) 23 (!) 22  Temp: 97.8 F (36.6 C)  97.6 F (36.4 C) 97.8 F (36.6 C)  TempSrc: Oral  Oral Oral  SpO2: 100%  97% 97%  Weight:  79.3 kg    Height:        Intake/Output Summary (Last 24 hours) at 01/31/2019 1401 Last data filed at 01/31/2019 1300 Gross per 24 hour  Intake 240 ml  Output 1350 ml  Net -1110 ml   Filed Weights   01/30/19 0928 01/30/19 1248 01/31/19 0650  Weight: 72 kg 71 kg 79.3 kg   Examination: Physical Exam:  Constitutional:  WN/WD obese African-American female currently in no acute distress and appears calm and resting is wearing 2 L of supplemental oxygen via nasal cannula Eyes: Lids and conjunctivae normal, sclerae anicteric  ENMT: External Ears, Nose appear normal. Grossly normal hearing. Mucous membranes are moist.  Neck: Appears normal, supple, no cervical masses, normal ROM, no appreciable thyromegaly; no JVD Respiratory: Diminished to auscultation bilaterally with some slightly coarse breath sounds, no wheezing, rales, rhonchi or crackles. Normal respiratory effort and patient is not tachypenic. No accessory muscle use.  Unlabored breathing but she is wearing 2 L of supplemental oxygen via nasal cannula again today Cardiovascular: RRR, no murmurs / rubs / gallops. S1 and S2 auscultated.  Trace extremity edema.  Abdomen: Soft, non-tender, non-distended. Bowel sounds positive x4.  GU: Deferred. Musculoskeletal: No clubbing / cyanosis of digits/nails. No joint deformity upper and lower extremities.  Skin: No rashes, lesions, ulcers on limited skin evaluation. No induration; Warm and dry.  Neurologic: CN 2-12 grossly intact with no focal deficits. Romberg sign and cerebellar reflexes not assessed.  Psychiatric: Normal judgment and insight. Alert and oriented x 3. Normal mood and appropriate affect.    Data Reviewed: I have personally reviewed following labs and imaging studies  CBC: Recent Labs  Lab 01/27/19 0201 01/28/19 0511 01/29/19 0259 01/30/19 0707 01/31/19 0653  WBC 13.3* 14.6* 15.7* 16.8* 13.5*  NEUTROABS 10.7* 11.2* 13.0* 13.8* 10.1*  HGB 7.3* 7.3* 6.7* 8.7* 8.2*  HCT 21.4* 22.1* 20.5* 26.5* 25.2*  MCV 81.4 83.4 84.7 83.6 85.7  PLT 331 347 335 236 407   Basic Metabolic Panel: Recent Labs  Lab 01/27/19 0201 01/28/19 0511 01/29/19 0259 01/30/19 0707 01/31/19 0653  NA 134* 137 136 137 141  K 3.9 3.8 3.7 3.7 3.4*  CL 97* 98 99 99 105  CO2 21* 26 24 23 27   GLUCOSE 166* 98 154* 163* 99    BUN 107* 70* 80* 92* 47*  CREATININE 9.12* 6.47* 7.36* 8.22* 5.02*  CALCIUM 8.0* 7.8* 7.8* 8.1* 7.7*  MG 2.1 1.8 1.9 2.0 1.7  PHOS 11.3* 7.9* 8.6* 8.7* 6.1*   GFR: Estimated Creatinine Clearance: 11.3 mL/min (A) (by C-G formula based on SCr of 5.02 mg/dL (H)). Liver Function Tests: Recent Labs  Lab 01/27/19 0201 01/28/19 0511 01/29/19 0259 01/30/19 0707 01/31/19 0653  AST 19 24 19 19 17   ALT 20 26 27 28 27   ALKPHOS 35* 30* 34* 37* 33*  BILITOT 0.7 0.3 0.5 0.4 0.3  PROT 4.7* 4.7* 4.6* 5.3* 4.7*  ALBUMIN 1.5* 1.5* 1.6* 1.6* 1.5*   No results  for input(s): LIPASE, AMYLASE in the last 168 hours. No results for input(s): AMMONIA in the last 168 hours. Coagulation Profile: No results for input(s): INR, PROTIME in the last 168 hours. Cardiac Enzymes: No results for input(s): CKTOTAL, CKMB, CKMBINDEX, TROPONINI in the last 168 hours. BNP (last 3 results) No results for input(s): PROBNP in the last 8760 hours. HbA1C: No results for input(s): HGBA1C in the last 72 hours. CBG: Recent Labs  Lab 01/30/19 0801 01/30/19 1655 01/30/19 2041 01/31/19 0803 01/31/19 1222  GLUCAP 150* 159* 185* 98 115*   Lipid Profile: No results for input(s): CHOL, HDL, LDLCALC, TRIG, CHOLHDL, LDLDIRECT in the last 72 hours. Thyroid Function Tests: No results for input(s): TSH, T4TOTAL, FREET4, T3FREE, THYROIDAB in the last 72 hours. Anemia Panel: Recent Labs    01/30/19 0707 01/31/19 0653  FERRITIN 284 204   Sepsis Labs: Recent Labs  Lab 01/29/19 0259 01/30/19 0707  PROCALCITON 0.82 0.57    No results found for this or any previous visit (from the past 240 hour(s)).  Radiology Studies: No results found.  Scheduled Meds: . amLODipine  5 mg Oral Daily  . Chlorhexidine Gluconate Cloth  6 each Topical Q0600  . darbepoetin (ARANESP) injection - NON-DIALYSIS  100 mcg Subcutaneous Q Wed-1800  . dexamethasone  6 mg Oral Daily  . heparin  7,500 Units Subcutaneous Q8H  . influenza vac  split quadrivalent PF  0.5 mL Intramuscular Tomorrow-1000  . insulin aspart  0-9 Units Subcutaneous TID WC  . pantoprazole  40 mg Oral Q1200   Continuous Infusions: . sodium chloride    . sodium chloride       LOS: 10 days   Kerney Elbe, DO [facility] PAGER is on [place]  If 7PM-7AM, please contact night-coverage [email or web address]

## 2019-02-01 ENCOUNTER — Inpatient Hospital Stay (HOSPITAL_COMMUNITY): Payer: HRSA Program

## 2019-02-01 LAB — CBC WITH DIFFERENTIAL/PLATELET
Abs Immature Granulocytes: 0.27 10*3/uL — ABNORMAL HIGH (ref 0.00–0.07)
Basophils Absolute: 0 10*3/uL (ref 0.0–0.1)
Basophils Relative: 0 %
Eosinophils Absolute: 0 10*3/uL (ref 0.0–0.5)
Eosinophils Relative: 0 %
HCT: 26.3 % — ABNORMAL LOW (ref 36.0–46.0)
Hemoglobin: 8 g/dL — ABNORMAL LOW (ref 12.0–15.0)
Immature Granulocytes: 2 %
Lymphocytes Relative: 9 %
Lymphs Abs: 1.1 10*3/uL (ref 0.7–4.0)
MCH: 27.3 pg (ref 26.0–34.0)
MCHC: 30.4 g/dL (ref 30.0–36.0)
MCV: 89.8 fL (ref 80.0–100.0)
Monocytes Absolute: 1.1 10*3/uL — ABNORMAL HIGH (ref 0.1–1.0)
Monocytes Relative: 10 %
Neutro Abs: 9.1 10*3/uL — ABNORMAL HIGH (ref 1.7–7.7)
Neutrophils Relative %: 79 %
Platelets: 337 10*3/uL (ref 150–400)
RBC: 2.93 MIL/uL — ABNORMAL LOW (ref 3.87–5.11)
RDW: 17.2 % — ABNORMAL HIGH (ref 11.5–15.5)
WBC: 11.6 10*3/uL — ABNORMAL HIGH (ref 4.0–10.5)
nRBC: 0.3 % — ABNORMAL HIGH (ref 0.0–0.2)

## 2019-02-01 LAB — PHOSPHORUS: Phosphorus: 5.7 mg/dL — ABNORMAL HIGH (ref 2.5–4.6)

## 2019-02-01 LAB — SEDIMENTATION RATE: Sed Rate: 35 mm/hr — ABNORMAL HIGH (ref 0–22)

## 2019-02-01 LAB — D-DIMER, QUANTITATIVE: D-Dimer, Quant: 3.79 ug/mL-FEU — ABNORMAL HIGH (ref 0.00–0.50)

## 2019-02-01 LAB — COMPREHENSIVE METABOLIC PANEL
ALT: 26 U/L (ref 0–44)
AST: 14 U/L — ABNORMAL LOW (ref 15–41)
Albumin: 1.6 g/dL — ABNORMAL LOW (ref 3.5–5.0)
Alkaline Phosphatase: 32 U/L — ABNORMAL LOW (ref 38–126)
Anion gap: 13 (ref 5–15)
BUN: 67 mg/dL — ABNORMAL HIGH (ref 8–23)
CO2: 23 mmol/L (ref 22–32)
Calcium: 7.9 mg/dL — ABNORMAL LOW (ref 8.9–10.3)
Chloride: 103 mmol/L (ref 98–111)
Creatinine, Ser: 5.89 mg/dL — ABNORMAL HIGH (ref 0.44–1.00)
GFR calc Af Amer: 8 mL/min — ABNORMAL LOW (ref >60)
GFR calc non Af Amer: 7 mL/min — ABNORMAL LOW (ref >60)
Glucose, Bld: 127 mg/dL — ABNORMAL HIGH (ref 70–99)
Potassium: 3.7 mmol/L (ref 3.5–5.1)
Sodium: 139 mmol/L (ref 135–145)
Total Bilirubin: 0.3 mg/dL (ref 0.3–1.2)
Total Protein: 4.8 g/dL — ABNORMAL LOW (ref 6.5–8.1)

## 2019-02-01 LAB — LACTATE DEHYDROGENASE: LDH: 217 U/L — ABNORMAL HIGH (ref 98–192)

## 2019-02-01 LAB — C-REACTIVE PROTEIN: CRP: 0.9 mg/dL (ref <1.0)

## 2019-02-01 LAB — GLUCOSE, CAPILLARY
Glucose-Capillary: 111 mg/dL — ABNORMAL HIGH (ref 70–99)
Glucose-Capillary: 122 mg/dL — ABNORMAL HIGH (ref 70–99)
Glucose-Capillary: 87 mg/dL (ref 70–99)
Glucose-Capillary: 99 mg/dL (ref 70–99)

## 2019-02-01 LAB — MAGNESIUM: Magnesium: 1.7 mg/dL (ref 1.7–2.4)

## 2019-02-01 LAB — FERRITIN: Ferritin: 201 ng/mL (ref 11–307)

## 2019-02-01 LAB — FIBRINOGEN: Fibrinogen: 569 mg/dL — ABNORMAL HIGH (ref 210–475)

## 2019-02-01 NOTE — Progress Notes (Signed)
Subjective:    She expresses her frustration about our conversation yesterday  Explored further  It is unclear if she is ESRD of will recover GFR  Discussed we need to begin plans for ongoing HD upon DC, either as AKI or ESRD  Currently using a temporary HD catheter  1.1 L UOP reported yesterday  SCr 5.0-->5.9, K 3.7  Pt w/o complaint   Objective Vital signs in last 24 hours: Vitals:   01/31/19 2000 01/31/19 2307 02/01/19 0424 02/01/19 0800  BP:  139/69  (!) 150/94  Pulse: 60   (!) 56  Resp: 19   (!) 21  Temp:  98.1 F (36.7 C) 98 F (36.7 C)   TempSrc:      SpO2: 96%   100%  Weight:      Height:       Weight change:   Intake/Output Summary (Last 24 hours) at 02/01/2019 1203 Last data filed at 02/01/2019 1000 Gross per 24 hour  Intake 240 ml  Output 1240 ml  Net -1000 ml    Assessment/ Plan: Pt is a 64 y.o. yo female with HTN baseline renal function unknown (was 1.2 in 2009 but no more recent data) who was admitted on 01/20/2019 with  COVID and crt of 8 and BUN well over 100 Assessment/Plan: 1. Renal-  Unknown renal function baseline.  Presented on 1/10 with crt of 8 and high BUN necessitating HD.  Dialysis dep't and no clear evidence of GFR recovery. Might be ESRD.    Still seeing increase in BUN and crt in between HD treatments.    We will watch UOP. Labs for another 24h.  If needs HD tomorrow will also move to [facility]  Given uncertainty of duration of HD dependence need to begin plan for outpt HD, esp since she is leaving area at DC.   2. HTN/vol-  BP normal to high-  Seems relatively euvolemic 3. Anemia- Cont ESA, transfuse prn 4. Covid-  Steroids and has finished remdesivir- on room air per [place]: Basic Metabolic Panel: Recent Labs  Lab 01/30/19 0707 01/31/19 0653 02/01/19 0510  NA 137 141 139  K 3.7 3.4* 3.7  CL 99 105 103  CO2 23 27 23   GLUCOSE 163* 99 127*  BUN 92* 47* 67*  CREATININE 8.22* 5.02* 5.89*  CALCIUM 8.1* 7.7* 7.9*   PHOS 8.7* 6.1* 5.7*   Liver Function Tests: Recent Labs  Lab 01/30/19 0707 01/31/19 0653 02/01/19 0510  AST 19 17 14*  ALT 28 27 26   ALKPHOS 37* 33* 32*  BILITOT 0.4 0.3 0.3  PROT 5.3* 4.7* 4.8*  ALBUMIN 1.6* 1.5* 1.6*   No results for input(s): LIPASE, AMYLASE in the last 168 hours. No results for input(s): AMMONIA in the last 168 hours. CBC: Recent Labs  Lab 01/28/19 0511 01/28/19 0511 01/29/19 0259 01/29/19 0259 01/30/19 0707 01/31/19 0653 02/01/19 0510  WBC 14.6*   < > 15.7*   < > 16.8* 13.5* 11.6*  NEUTROABS 11.2*   < > 13.0*   < > 13.8* 10.1* 9.1*  HGB 7.3*   < > 6.7*   < > 8.7* 8.2* 8.0*  HCT 22.1*   < > 20.5*   < > 26.5* 25.2* 26.3*  MCV 83.4  --  84.7  --  83.6 85.7 89.8  PLT 347   < > 335   < > 236 323 337   < > = values in this interval not displayed.  Cardiac Enzymes: No results for input(s): CKTOTAL, CKMB, CKMBINDEX, TROPONINI in the last 168 hours. CBG: Recent Labs  Lab 01/30/19 2041 01/31/19 0803 01/31/19 1222 01/31/19 1704 02/01/19 0806  GLUCAP 185* 98 115* 249* 99    Iron Studies:  Recent Labs    02/01/19 0510  FERRITIN 201   Studies/Results: DG CHEST PORT 1 VIEW  Result Date: 02/01/2019 CLINICAL DATA:  64 year old female with history of shortness of breath. COVID-19 positive. EXAM: PORTABLE CHEST 1 VIEW COMPARISON:  Chest x-ray 01/29/2019. FINDINGS: There is a right-sided internal jugular central venous catheter with tip terminating in the mid superior vena cava. Patchy multifocal ill-defined opacities and areas of interstitial prominence again noted throughout the lungs bilaterally. No pleural effusions. No evidence of pulmonary edema. Heart size is upper limits of normal. Upper mediastinal contours are within normal limits. IMPRESSION: 1. Support apparatus, as above. 2. Multilobar bilateral pneumonia with similar aeration compared to the prior examination, as above. Electronically Signed   By: Vinnie Langton M.D.   On: 02/01/2019 08:27    Medications: Infusions: . sodium chloride    . sodium chloride      Scheduled Medications: . amLODipine  5 mg Oral Daily  . Chlorhexidine Gluconate Cloth  6 each Topical Q0600  . darbepoetin (ARANESP) injection - NON-DIALYSIS  100 mcg Subcutaneous Q Wed-1800  . heparin  7,500 Units Subcutaneous Q8H  . influenza vac split quadrivalent PF  0.5 mL Intramuscular Tomorrow-1000  . insulin aspart  0-9 Units Subcutaneous TID WC  . pantoprazole  40 mg Oral Q1200    have reviewed scheduled and prn medications.  Physical Exam: Gen-  NAD, pleasant HEENT-  eomi Lungs-   Ant clear CV-  RRR abd-  Soft, non tender Ext-  No sig edema R IJ Temp HD cath c/d/i  02/01/2019,12:03 PM  LOS: 11 days

## 2019-02-01 NOTE — TOC Initial Note (Signed)
Transition of Care [facility]) - Initial/Assessment Note    Patient Details  Name: Tracy Escobar MRN: [id number] Date of Birth: Jul 21, 1955  Transition of Care [facility]) CM/SW Contact:    Maryclare Labrador, RN Phone Number: 02/01/2019, 12:40 PM  Clinical Narrative:   PTA independent from home.  Pt will move in with her daughter in [facility] at discharge.  Daughter confirms address of discharge to be [place] [address].  Pt and daughter confirms she doesn't have a PCP in [facility] - both in agreement for CM to reach out to local clinics for PCP establishment.  CM was able to make appt with [facility] in [place] - approximately 20 miles from pts new address. Office informed that pt is covid positive - office informed CM that as long as pt is not showing symptoms she can come to the office visit - per COVID test date pt  - pt /daughter made aware that if she starts to show symptoms she needs to make the clinic aware  CM will provide specifics of appt on AVS.  This clinic has a sliding scale fee and will accept non insured pts - sliding scale will also apply to pharmacy benefits in the [employer].  Pt is new to HD - will need clipping.  Renal provided discharge address for clipping purposes.  CM explained to daughter that pt will likely needs a covid positive HD center and then transition to a non covid positive facility.      CM informed by bedside nurse that pt is min to mode assist.  Pt will not have 24 hour supervision at discharge as pts daughter works.  CM requested for PT eval for pt.          Expected Discharge Plan: Home/Self Care Barriers to Discharge: Barriers Unresolved (comment)(clipping)   Patient Goals and CMS Choice        Expected Discharge Plan and Services Expected Discharge Plan: Home/Self Care       Living arrangements for the past 2 months: Single Family Home                                       Prior Living Arrangements/Services Living arrangements for the past 2 months: Single Family Home Lives with:: Self   Do you feel safe going back to the place where you live?: (pt will move in with her daughter at discharge)      Need for Family Participation in Patient Care: Yes (Comment) Care giver support system in place?: Yes (comment)   Criminal Activity/Legal Involvement Pertinent to Current Situation/Hospitalization: No - Comment as needed  Activities of Daily Living Home Assistive Devices/Equipment: None ADL Screening (condition at time of admission) Patient's cognitive ability adequate to safely complete daily activities?: Yes Is the patient deaf or have difficulty hearing?: No Does the patient have difficulty seeing, even when wearing glasses/contacts?: No Does the patient have difficulty concentrating, remembering, or making decisions?: No Patient able to express need for assistance with ADLs?: Yes Does the patient have difficulty dressing or bathing?: Yes Independently performs ADLs?: No Communication: Independent Dressing (OT): Needs assistance Is this a change from baseline?: Pre-admission baseline Grooming: Needs assistance Is this a change from baseline?: Pre-admission baseline Feeding: Independent Bathing: Needs assistance Is this a change from baseline?: Pre-admission baseline Toileting: Independent In/Out Bed:  Independent Walks in Home: Independent Does the patient have difficulty walking or climbing stairs?: No Weakness of Legs: None Weakness of Arms/Hands: None  Permission Sought/Granted   Permission granted to share information with : Yes, Verbal Permission Granted  Share Information with NAME: Daughter - Bary Richard  Permission granted to share info w AGENCY: [facility]        Emotional Assessment   Attitude/Demeanor/Rapport: Gracious, Self-Confident, Engaged Affect (typically observed): Accepting,  Adaptable Orientation: : Oriented to Self, Oriented to Place, Oriented to  Time, Oriented to Situation      Admission diagnosis:  Renal failure [N19] Acute renal failure, unspecified acute renal failure type ([place]) [N17.9] Altered mental status, unspecified altered mental status type [R41.82] COVID-19 [U07.1] Patient Active Problem List   Diagnosis Date Noted  . Renal failure 01/21/2019  . COVID-19 01/21/2019  . Altered mental status    PCP:  Patient, No Pcp Per Pharmacy:   [facility] [place], [place] [address] [place] [address] Phone: [phone] Fax: [phone]     Social Determinants of Health (SDOH) Interventions    Readmission Risk Interventions No flowsheet data found.

## 2019-02-01 NOTE — Progress Notes (Signed)
PROGRESS NOTE    Tracy Escobar  [id number] DOB: 08/15/1955 DOA: 01/20/2019 PCP: Patient, No Pcp Per   Brief Narrative:  PCCM transfer to [facility] 1/12, Tracy Escobar is a 63/F with no significant past medical history was brought to the emergency room on 1/9 with encephalopathy, decreased p.o. intake for a few days. -The emergency room she was noted to have COVID-19 infection with pneumonia and severe acute kidney injury with a creatinine of 8/BUN of 140 from a baseline of 1.2. -She was admitted to the ICU, underwent temporary HD catheter placement and temporary dialysis from 1/10 -Also treated with IV remdesivir and Decadron for pneumonia -Nephrology following -Transferred to [facility] service on 1/12 -Since then has required intermittent dialysis and 1 unit of PRBC transfusion  Assessment & Plan:  AKI/ATN now may be ESRD Hyperphosphatemia -Admitted with creatinine of 8.6 and BUN of 148, felt to be secondary to Covid, and prerenal component as well as NSAID use(naproxen on med list) -No recent labs in our system, baseline unknown, last creatinine  from 2009 was 1.2 -CT without hydronephrosis -Nephrology consulting, underwent temporary HD catheter placement on 1/9 and dialysis on 1/10, 1/13, 1/15 and 1/18 -Good urine output of 1 L in the last 24 hours, mild worsening of creatinine between dialysis -Continue to monitor, no indications for dialysis today  COVID-19 pneumonia -Imaging on admission consistent with COVID-19 pneumonia -S/p 5 Days of Remdesivir; and 10 days of Decadron -Was also getting CAP coverage in ICU, this was completed -Attempt to wean off O2 as tolerated -Inflammatory markers improving  Acute metabolic Encephalopathy -Secondary to uremia, COVID-19 pneumonia -Resolved  Normocytic Anemia -Hemoglobin has gradually trended down from 9-10 range to 6.7 few days ago  -Transfused 1 unit of PRBC this admission  -Anemia panel suggestive of chronic disease, no overt bleeding  noted  -Continue EPO -Monitor hemoglobin  Hyperkalemia   -Resolved with HD  Hyperglycemia Diabetes Mellitus Type 2 - Hemoglobin A1c was 6.7 -CBG stable, continue sliding scale  Obesity  -Estimated body mass index is 31.98 kg/m as calculated from the following:   Height as of this encounter: 5\' 2"  (1.575 m).   Weight as of this encounter: 79.3 kg. -Weight Loss and Dietary Counseling   HTN -C/w Amlodipine 5 mg po Daily   DVT prophylaxis: Heparin 5,000 units sq q8h Code Status: Full code Family Communication: No family at bedside, Dr. Alfredia Ferguson notified daughter yesterday  disposition Plan: Pending Improvement in Renal Fxn and respiratory status  Consultants:   Nephrology  Procedures: Dialysis   Antimicrobials:  Anti-infectives (From admission, onward)   Start     Dose/Rate Route Frequency Ordered Stop   01/22/19 1000  remdesivir 100 mg in sodium chloride 0.9 % 100 mL IVPB     100 mg 200 mL/hr over 30 Minutes Intravenous Daily 01/21/19 0552 01/25/19 1900   01/21/19 0800  azithromycin (ZITHROMAX) 500 mg in sodium chloride 0.9 % 250 mL IVPB     500 mg 250 mL/hr over 60 Minutes Intravenous Every 24 hours 01/21/19 0559 01/25/19 2025   01/21/19 0630  remdesivir 200 mg in sodium chloride 0.9% 250 mL IVPB     200 mg 580 mL/hr over 30 Minutes Intravenous Once 01/21/19 0552 01/21/19 0930   01/21/19 0600  cefTRIAXone (ROCEPHIN) 1 g in sodium chloride 0.9 % 100 mL IVPB     1 g 200 mL/hr over 30 Minutes Intravenous Every 24 hours 01/21/19 0559 01/25/19 0539     Subjective: -Remains upset about the prospect  of needing long-term dialysis, no nausea vomiting, no dyspnea  Objective: Vitals:   01/31/19 2307 02/01/19 0424 02/01/19 0800 02/01/19 1200  BP: 139/69  (!) 150/94 (!) 152/76  Pulse:   (!) 56 (!) 51  Resp:   (!) 21 (!) 25  Temp: 98.1 F (36.7 C) 98 F (36.7 C)    TempSrc:      SpO2:   100% 99%  Weight:      Height:        Intake/Output Summary (Last 24 hours) at  02/01/2019 1402 Last data filed at 02/01/2019 1200 Gross per 24 hour  Intake 120 ml  Output 1020 ml  Net -900 ml   Filed Weights   01/30/19 0928 01/30/19 1248 01/31/19 0650  Weight: 72 kg 71 kg 79.3 kg   Examination: Physical Exam:  Gen: Awake, Alert, Oriented X 3, no distress HEENT: no JVD Lungs: CTAB CVS: S1S2/RRR Abd: soft, Non tender, non distended, BS present Extremities: No edema Skin: no new rashes   Data Reviewed: I have personally reviewed following labs and imaging studies  CBC: Recent Labs  Lab 01/28/19 0511 01/29/19 0259 01/30/19 0707 01/31/19 0653 02/01/19 0510  WBC 14.6* 15.7* 16.8* 13.5* 11.6*  NEUTROABS 11.2* 13.0* 13.8* 10.1* 9.1*  HGB 7.3* 6.7* 8.7* 8.2* 8.0*  HCT 22.1* 20.5* 26.5* 25.2* 26.3*  MCV 83.4 84.7 83.6 85.7 89.8  PLT 347 335 236 323 [id number]   Basic Metabolic Panel: Recent Labs  Lab 01/28/19 0511 01/29/19 0259 01/30/19 0707 01/31/19 0653 02/01/19 0510  NA 137 136 137 141 139  K 3.8 3.7 3.7 3.4* 3.7  CL 98 99 99 105 103  CO2 26 24 23 27 23   GLUCOSE 98 154* 163* 99 127*  BUN 70* 80* 92* 47* 67*  CREATININE 6.47* 7.36* 8.22* 5.02* 5.89*  CALCIUM 7.8* 7.8* 8.1* 7.7* 7.9*  MG 1.8 1.9 2.0 1.7 1.7  PHOS 7.9* 8.6* 8.7* 6.1* 5.7*   GFR: Estimated Creatinine Clearance: 9.7 mL/min (A) (by C-G formula based on SCr of 5.89 mg/dL (H)). Liver Function Tests: Recent Labs  Lab 01/28/19 0511 01/29/19 0259 01/30/19 0707 01/31/19 0653 02/01/19 0510  AST 24 19 19 17  14*  ALT 26 27 28 27 26   ALKPHOS 30* 34* 37* 33* 32*  BILITOT 0.3 0.5 0.4 0.3 0.3  PROT 4.7* 4.6* 5.3* 4.7* 4.8*  ALBUMIN 1.5* 1.6* 1.6* 1.5* 1.6*   No results for input(s): LIPASE, AMYLASE in the last 168 hours. No results for input(s): AMMONIA in the last 168 hours. Coagulation Profile: No results for input(s): INR, PROTIME in the last 168 hours. Cardiac Enzymes: No results for input(s): CKTOTAL, CKMB, CKMBINDEX, TROPONINI in the last 168 hours. BNP (last 3 results) No  results for input(s): PROBNP in the last 8760 hours. HbA1C: No results for input(s): HGBA1C in the last 72 hours. CBG: Recent Labs  Lab 01/31/19 0803 01/31/19 1222 01/31/19 1704 02/01/19 0806 02/01/19 1230  GLUCAP 98 115* 249* 99 87   Lipid Profile: No results for input(s): CHOL, HDL, LDLCALC, TRIG, CHOLHDL, LDLDIRECT in the last 72 hours. Thyroid Function Tests: No results for input(s): TSH, T4TOTAL, FREET4, T3FREE, THYROIDAB in the last 72 hours. Anemia Panel: Recent Labs    01/31/19 0653 02/01/19 0510  FERRITIN 204 201   Sepsis Labs: Recent Labs  Lab 01/29/19 0259 01/30/19 0707  PROCALCITON 0.82 0.57    No results found for this or any previous visit (from the past 240 hour(s)).  Radiology Studies: DG CHEST PORT 1  VIEW  Result Date: 02/01/2019 CLINICAL DATA:  64 year old female with history of shortness of breath. COVID-19 positive. EXAM: PORTABLE CHEST 1 VIEW COMPARISON:  Chest x-ray 01/29/2019. FINDINGS: There is a right-sided internal jugular central venous catheter with tip terminating in the mid superior vena cava. Patchy multifocal ill-defined opacities and areas of interstitial prominence again noted throughout the lungs bilaterally. No pleural effusions. No evidence of pulmonary edema. Heart size is upper limits of normal. Upper mediastinal contours are within normal limits. IMPRESSION: 1. Support apparatus, as above. 2. Multilobar bilateral pneumonia with similar aeration compared to the prior examination, as above. Electronically Signed   By: Vinnie Langton M.D.   On: 02/01/2019 08:27    Scheduled Meds: . amLODipine  5 mg Oral Daily  . Chlorhexidine Gluconate Cloth  6 each Topical Q0600  . darbepoetin (ARANESP) injection - NON-DIALYSIS  100 mcg Subcutaneous Q Wed-1800  . heparin  7,500 Units Subcutaneous Q8H  . influenza vac split quadrivalent PF  0.5 mL Intramuscular Tomorrow-1000  . insulin aspart  0-9 Units Subcutaneous TID WC  . pantoprazole  40 mg  Oral Q1200   Continuous Infusions: . sodium chloride    . sodium chloride       LOS: 11 days   Domenic Polite, MD [facility]

## 2019-02-01 NOTE — Care Management (Signed)
Pt will discharge to her daughters home in [facility] [place].  Discharge location will be [place] [address].  Renal Coordinator Pam informed of clipping needed and address of discharge

## 2019-02-02 LAB — FERRITIN: Ferritin: 186 ng/mL (ref 11–307)

## 2019-02-02 LAB — RENAL FUNCTION PANEL
Albumin: 1.6 g/dL — ABNORMAL LOW (ref 3.5–5.0)
Anion gap: 14 (ref 5–15)
BUN: 76 mg/dL — ABNORMAL HIGH (ref 8–23)
CO2: 24 mmol/L (ref 22–32)
Calcium: 8.1 mg/dL — ABNORMAL LOW (ref 8.9–10.3)
Chloride: 108 mmol/L (ref 98–111)
Creatinine, Ser: 6.11 mg/dL — ABNORMAL HIGH (ref 0.44–1.00)
GFR calc Af Amer: 8 mL/min — ABNORMAL LOW (ref >60)
GFR calc non Af Amer: 7 mL/min — ABNORMAL LOW (ref >60)
Glucose, Bld: 95 mg/dL (ref 70–99)
Phosphorus: 6.4 mg/dL — ABNORMAL HIGH (ref 2.5–4.6)
Potassium: 4.2 mmol/L (ref 3.5–5.1)
Sodium: 146 mmol/L — ABNORMAL HIGH (ref 135–145)

## 2019-02-02 LAB — GLUCOSE, CAPILLARY
Glucose-Capillary: 87 mg/dL (ref 70–99)
Glucose-Capillary: 80 mg/dL (ref 70–99)
Glucose-Capillary: 118 mg/dL — ABNORMAL HIGH (ref 70–99)

## 2019-02-02 LAB — CBC
HCT: 26 % — ABNORMAL LOW (ref 36.0–46.0)
Hemoglobin: 7.8 g/dL — ABNORMAL LOW (ref 12.0–15.0)
MCH: 27.5 pg (ref 26.0–34.0)
MCHC: 30 g/dL (ref 30.0–36.0)
MCV: 91.5 fL (ref 80.0–100.0)
Platelets: 259 10*3/uL (ref 150–400)
RBC: 2.84 MIL/uL — ABNORMAL LOW (ref 3.87–5.11)
RDW: 17.3 % — ABNORMAL HIGH (ref 11.5–15.5)
WBC: 9.4 10*3/uL (ref 4.0–10.5)
nRBC: 0.3 % — ABNORMAL HIGH (ref 0.0–0.2)

## 2019-02-02 LAB — C-REACTIVE PROTEIN: CRP: 0.8 mg/dL (ref <1.0)

## 2019-02-02 LAB — D-DIMER, QUANTITATIVE: D-Dimer, Quant: 3.49 ug/mL-FEU — ABNORMAL HIGH (ref 0.00–0.50)

## 2019-02-02 NOTE — Plan of Care (Signed)

## 2019-02-02 NOTE — Progress Notes (Signed)
Subjective:    Only a slight worsening in serum creatinine compared to yesterday  UOP increased to 1.8 L  Without complaint, feels well   Objective Vital signs in last 24 hours: Vitals:   02/02/19 0002 02/02/19 0003 02/02/19 0400 02/02/19 0907  BP: (!) 148/70 (!) 148/70 (!) 148/62 (!) 158/64  Pulse: 60 (!) 54 (!) 59 80  Resp: 20 18 18  (!) 23  Temp: 98 F (36.7 C) 98.6 F (37 C) 98 F (36.7 C) 99 F (37.2 C)  TempSrc:  Oral Oral Oral  SpO2: 98% 99% 95% 96%  Weight:      Height:       Weight change:   Intake/Output Summary (Last 24 hours) at 02/02/2019 1207 Last data filed at 02/02/2019 0600 Gross per 24 hour  Intake 700 ml  Output 1550 ml  Net -850 ml    Assessment/ Plan: Pt is a 64 y.o. yo female with HTN baseline renal function unknown (was 1.2 in 2009 but no more recent data) who was admitted on 01/20/2019 with  COVID and crt of 8 and BUN well over 100 Assessment/Plan: 1. Renal-  Unknown renal function baseline.  Presented on 1/10 with crt of 8 and high BUN necessitating HD.  In the past 24 hours her urine output has improved and intradialytic change in serum creatinine has been very slight.  She might be stabilizing with sufficient GFR to not require further dialysis.  Hold on dialysis today, will follow up again tomorrow to see how she is doing.  She does have a temporary HD catheter which will need to be addressed either way.    2. HTN/vol-  BP normal to high-  Seems relatively euvolemic 3. Anemia- Cont ESA, transfuse prn 4. Covid-  has finished and steroids remdesivir- on room air per [place]: Basic Metabolic Panel: Recent Labs  Lab 01/31/19 0653 02/01/19 0510 02/02/19 0514  NA 141 139 146*  K 3.4* 3.7 4.2  CL 105 103 108  CO2 27 23 24   GLUCOSE 99 127* 95  BUN 47* 67* 76*  CREATININE 5.02* 5.89* 6.11*  CALCIUM 7.7* 7.9* 8.1*  PHOS 6.1* 5.7* 6.4*   Liver Function Tests: Recent Labs  Lab 01/30/19 0707 01/30/19 0707  01/31/19 0653 02/01/19 0510 02/02/19 0514  AST 19  --  17 14*  --   ALT 28  --  27 26  --   ALKPHOS 37*  --  33* 32*  --   BILITOT 0.4  --  0.3 0.3  --   PROT 5.3*  --  4.7* 4.8*  --   ALBUMIN 1.6*   < > 1.5* 1.6* 1.6*   < > = values in this interval not displayed.   No results for input(s): LIPASE, AMYLASE in the last 168 hours. No results for input(s): AMMONIA in the last 168 hours. CBC: Recent Labs  Lab 01/29/19 0259 01/29/19 0259 01/30/19 0707 01/30/19 0707 01/31/19 0653 02/01/19 0510 02/02/19 0514  WBC 15.7*   < > 16.8*   < > 13.5* 11.6* 9.4  NEUTROABS 13.0*   < > 13.8*  --  10.1* 9.1*  --   HGB 6.7*   < > 8.7*   < > 8.2* 8.0* 7.8*  HCT 20.5*   < > 26.5*   < > 25.2* 26.3* 26.0*  MCV 84.7  --  83.6  --  85.7 89.8 91.5  PLT 335   < > 236   < >  323 337 259   < > = values in this interval not displayed.   Cardiac Enzymes: No results for input(s): CKTOTAL, CKMB, CKMBINDEX, TROPONINI in the last 168 hours. CBG: Recent Labs  Lab 02/01/19 1230 02/01/19 1645 02/01/19 2335 02/02/19 0808 02/02/19 1134  GLUCAP 87 111* 122* 87 80    Iron Studies:  Recent Labs    02/02/19 0514  FERRITIN 186   Studies/Results: DG CHEST PORT 1 VIEW  Result Date: 02/01/2019 CLINICAL DATA:  64 year old female with history of shortness of breath. COVID-19 positive. EXAM: PORTABLE CHEST 1 VIEW COMPARISON:  Chest x-ray 01/29/2019. FINDINGS: There is a right-sided internal jugular central venous catheter with tip terminating in the mid superior vena cava. Patchy multifocal ill-defined opacities and areas of interstitial prominence again noted throughout the lungs bilaterally. No pleural effusions. No evidence of pulmonary edema. Heart size is upper limits of normal. Upper mediastinal contours are within normal limits. IMPRESSION: 1. Support apparatus, as above. 2. Multilobar bilateral pneumonia with similar aeration compared to the prior examination, as above. Electronically Signed   By: Vinnie Langton M.D.   On: 02/01/2019 08:27   Medications: Infusions: . sodium chloride    . sodium chloride      Scheduled Medications: . amLODipine  5 mg Oral Daily  . Chlorhexidine Gluconate Cloth  6 each Topical Q0600  . darbepoetin (ARANESP) injection - NON-DIALYSIS  100 mcg Subcutaneous Q Wed-1800  . heparin  7,500 Units Subcutaneous Q8H  . influenza vac split quadrivalent PF  0.5 mL Intramuscular Tomorrow-1000  . insulin aspart  0-9 Units Subcutaneous TID WC  . pantoprazole  40 mg Oral Q1200    have reviewed scheduled and prn medications.  Physical Exam: Gen-  NAD, pleasant HEENT-  eomi Lungs-   Ant clear CV-  RRR abd-  Soft, non tender Ext-  No sig edema R IJ Temp HD cath c/d/i  02/02/2019,12:07 PM  LOS: 12 days

## 2019-02-02 NOTE — Evaluation (Signed)
Occupational Therapy Evaluation Patient Details Name: Tracy Escobar MRN: [id number] DOB: [birth year] Today's Date: 02/02/2019    History of Present Illness Tracy Escobar is a 63/F with no significant past medical history was brought to the emergency room on 1/9 with encephalopathy, decreased p.o. intake for a few days.  noted to have Covid as well as AKI.  Has been recieving HD here.    Clinical Impression   Pt was living alone prior to admission. Presents with impaired cognition, decreased activity tolerance and imbalance in standing. She requires min guard assist and RW to ambulate and up to min assist for ADL. Pt with stable VS on RA although after ambulating and seated rest break she proclaimed she needed to lay down quickly. Agreed to get back up to chair after VS assessed. Pt does not have 24 hour care and will need SNF upon discharge. Will follow acutely.    Follow Up Recommendations  SNF;Supervision/Assistance - 24 hour    Equipment Recommendations       Recommendations for Other Services       Precautions / Restrictions Precautions Precautions: Fall Restrictions Weight Bearing Restrictions: No      Mobility Bed Mobility Overal bed mobility: Needs Assistance Bed Mobility: Supine to Sit     Supine to sit: Supervision     General bed mobility comments: for lines/safety  Transfers Overall transfer level: Needs assistance Equipment used: Rolling walker (2 wheeled) Transfers: Sit to/from Stand Sit to Stand: Min guard         General transfer comment: cues for hand placement    Balance Overall balance assessment: Needs assistance   Sitting balance-Leahy Scale: Good Sitting balance - Comments: no LOB donning her socks   Standing balance support: Bilateral upper extremity supported;During functional activity Standing balance-Leahy Scale: Poor Standing balance comment: relies on RW and external support.                            ADL either  performed or assessed with clinical judgement   ADL Overall ADL's : Needs assistance/impaired Eating/Feeding: Independent;Sitting   Grooming: Wash/dry face;Sitting;Set up Grooming Details (indicate cue type and reason): decreased thoroughness Upper Body Bathing: Minimal assistance;Sitting   Lower Body Bathing: Minimal assistance;Sit to/from stand   Upper Body Dressing : Set up;Sitting   Lower Body Dressing: Min guard;Sit to/from stand   Toilet Transfer: Min guard;Ambulation   Toileting- Clothing Manipulation and Hygiene: Min guard;Sit to/from stand       Functional mobility during ADLs: Min guard       Vision Patient Visual Report: No change from baseline       Perception     Praxis      Pertinent Vitals/Pain Pain Assessment: No/denies pain     Hand Dominance Right   Extremity/Trunk Assessment Upper Extremity Assessment Upper Extremity Assessment: Overall WFL for tasks assessed   Lower Extremity Assessment Lower Extremity Assessment: Defer to PT evaluation   Cervical / Trunk Assessment Cervical / Trunk Assessment: Kyphotic   Communication Communication Communication: No difficulties   Cognition Arousal/Alertness: Awake/alert Behavior During Therapy: WFL for tasks assessed/performed Overall Cognitive Status: Impaired/Different from baseline Area of Impairment: Orientation;Following commands;Safety/judgement;Memory;Awareness;Problem solving                 Orientation Level: Disoriented to;Time   Memory: Decreased short-term memory Following Commands: Follows one step commands inconsistently;Follows one step commands with increased time Safety/Judgement: Decreased awareness of safety;Decreased awareness of deficits Awareness: Intellectual  Problem Solving: Requires verbal cues;Slow processing     General Comments       Exercises     Shoulder Instructions      Home Living Family/patient expects to be discharged to:: Private  residence Living Arrangements: Alone Available Help at Discharge: Family;Available PRN/intermittently Type of Home: House Home Access: Stairs to enter [employer] of Steps: 2 Entrance Stairs-Rails: None Home Layout: Two level;Bed/bath upstairs Alternate Level Stairs-Number of Steps: 16 Alternate Level Stairs-Rails: Right Bathroom Shower/Tub: Occupational psychologist: Standard     Home Equipment: None          Prior Functioning/Environment Level of Independence: Independent                 OT Problem List: Decreased activity tolerance;Decreased strength;Impaired balance (sitting and/or standing);Decreased cognition;Decreased safety awareness;Decreased knowledge of use of DME or AE      OT Treatment/Interventions: Self-care/ADL training;DME and/or AE instruction;Energy conservation;Cognitive remediation/compensation;Therapeutic activities;Patient/family education;Balance training    OT Goals(Current goals can be found in the care plan section) Acute Rehab OT Goals Patient Stated Goal: to be independnent OT Goal Formulation: With patient Time For Goal Achievement: 02/16/19 Potential to Achieve Goals: Good ADL Goals Pt Will Perform Grooming: with supervision;standing Pt Will Perform Lower Body Bathing: with supervision;sit to/from stand Pt Will Perform Lower Body Dressing: with supervision;sit to/from stand Pt Will Transfer to Toilet: with supervision;ambulating Pt Will Perform Toileting - Clothing Manipulation and hygiene: with supervision;sit to/from stand Additional ADL Goal #1: Pt will participate in formal cognitive assessment.  OT Frequency: Min 2X/week   Barriers to D/C: Decreased caregiver support;Inaccessible home environment          Co-evaluation PT/OT/SLP Co-Evaluation/Treatment: Yes Reason for Co-Treatment: For patient/therapist safety   OT goals addressed during session: ADL's and self-care      AM-PAC OT "6 Clicks" Daily  Activity     Outcome Measure Help from another person eating meals?: None Help from another person taking care of personal grooming?: A Little Help from another person toileting, which includes using toliet, bedpan, or urinal?: A Little Help from another person bathing (including washing, rinsing, drying)?: A Little Help from another person to put on and taking off regular upper body clothing?: A Little Help from another person to put on and taking off regular lower body clothing?: A Little 6 Click Score: 19   End of Session Equipment Utilized During Treatment: Gait belt;Rolling walker  Activity Tolerance: Patient limited by fatigue Patient left: in chair;with call bell/phone within reach;with chair alarm set  OT Visit Diagnosis: Unsteadiness on feet (R26.81);Other symptoms and signs involving cognitive function;Muscle weakness (generalized) (M62.81)                Time: [id number] OT Time Calculation (min): 24 min Charges:  OT General Charges $OT Visit: 1 Visit OT Evaluation $OT Eval Moderate Complexity: 1 Mod  Nestor Lewandowsky, OTR/L Acute Rehabilitation Services Pager: [phone] Office: [phone]  Malka So 02/02/2019, 1:59 PM

## 2019-02-02 NOTE — Evaluation (Addendum)
Physical Therapy Evaluation Patient Details Name: Tracy Escobar MRN: [id number] DOB: 09-Apr-1955 Today's Date: 02/02/2019   History of Present Illness  Tracy Escobar is a 63/F with no significant past medical history was brought to the emergency room on 1/9 with encephalopathy, decreased p.o. intake for a few days.  noted to have Covid as well as AKI.  Has been recieving HD here.   Clinical Impression  Pt admitted with above diagnosis. Pt was able to ambulate with RW 110 feet with min guard assist. Pt needs cues for safety.  Got very fatigued after walk in hall and just wanted to lie down due to fatigue.  Note that pt is confused intermittently as well.  She also could not tell PT/OT about home equipment or set up of daughters home.  Called pts daughter as this PT recommends 24 hour care at home and daughter states that this cannot be provided and she will need to be able to stay alone.  At current level and with pt confusion, she would need SNF.  Daughter made aware that she would not get PT or OT at [facility] but she still wants SNF.  Will follow acutely.  Pt currently with functional limitations due to the deficits listed below (see PT Problem List). Pt will benefit from skilled PT to increase their independence and safety with mobility to allow discharge to the venue listed below.    Initial VS:  82 bpm, 95% 1L, 158/64   After activity:  168/79, 89% RA with activity, 81 bpm.  Replaced 1LO2 with sats >91%.   Follow Up Recommendations SNF;Supervision/Assistance - 24 hour    Equipment Recommendations  Rolling walker with 5" wheels;3in1 (PT)    Recommendations for Other Services       Precautions / Restrictions Precautions Precautions: Fall Restrictions Weight Bearing Restrictions: No      Mobility  Bed Mobility Overal bed mobility: Needs Assistance Bed Mobility: Supine to Sit     Supine to sit: Min guard        Transfers Overall transfer level: Needs assistance Equipment used:  Rolling walker (2 wheeled) Transfers: Sit to/from Stand Sit to Stand: Min guard         General transfer comment: Pt was able to come to EOB with min guard assist.   Ambulation/Gait Ambulation/Gait assistance: Min guard Gait Distance (Feet): 110 Feet Assistive device: Rolling walker (2 wheeled) Gait Pattern/deviations: Step-through pattern;Decreased stride length;Trunk flexed;Drifts right/left   Gait velocity interpretation: <1.31 ft/sec, indicative of household ambulator General Gait Details: Pt was able to ambulate with RW with min guard assist in halls.  Cues to stay close to RW. also a little assist steering rW. When pt got back to room, was going to stand at sink and wash face but pt asked to sit down becasue she was tired.  Pt sat down and then said "I need to get back in bed."  Pt states she wasnt dizzy just tired.  Assisted her to bed.  Once she was there a few minutes talked pt into getting in recliner with feet elevated.    Stairs            Wheelchair Mobility    Modified Rankin (Stroke Patients Only)       Balance Overall balance assessment: Needs assistance Sitting-balance support: No upper extremity supported;Feet supported Sitting balance-Leahy Scale: Fair     Standing balance support: Bilateral upper extremity supported;During functional activity Standing balance-Leahy Scale: Poor Standing balance comment: relies on RW and external  support.                              Pertinent Vitals/Pain Pain Assessment: No/denies pain    Home Living Family/patient expects to be discharged to:: Private residence Living Arrangements: Children(going to live with daughter) Available Help at Discharge: Family;Available PRN/intermittently(daguhter works) Type of Home: House Home Access: Stairs to enter Entrance Stairs-Rails: None [employer] of Steps: 2 Home Layout: Two level;Bed/bath upstairs Home Equipment: None      Prior Function Level  of Independence: Independent               Hand Dominance   Dominant Hand: Right    Extremity/Trunk Assessment   Upper Extremity Assessment Upper Extremity Assessment: Defer to OT evaluation    Lower Extremity Assessment Lower Extremity Assessment: Generalized weakness    Cervical / Trunk Assessment Cervical / Trunk Assessment: Kyphotic  Communication   Communication: No difficulties  Cognition Arousal/Alertness: Awake/alert Behavior During Therapy: WFL for tasks assessed/performed Overall Cognitive Status: Impaired/Different from baseline Area of Impairment: Orientation;Following commands;Safety/judgement;Memory;Awareness;Problem solving                 Orientation Level: Disoriented to;Time   Memory: Decreased short-term memory Following Commands: Follows one step commands inconsistently;Follows one step commands with increased time Safety/Judgement: Decreased awareness of safety;Decreased awareness of deficits   Problem Solving: Requires verbal cues;Slow processing        General Comments General comments (skin integrity, edema, etc.): Edema in bil LEs right >left.     Exercises     Assessment/Plan    PT Assessment Patient needs continued PT services  PT Problem List Decreased activity tolerance;Decreased balance;Decreased mobility;Decreased knowledge of use of DME;Decreased safety awareness;Decreased knowledge of precautions;Decreased cognition;Cardiopulmonary status limiting activity       PT Treatment Interventions DME instruction;Gait training;Functional mobility training;Stair training;Therapeutic activities;Therapeutic exercise;Balance training;Patient/family education    PT Goals (Current goals can be found in the Care Plan section)  Acute Rehab PT Goals Patient Stated Goal: to be independnent PT Goal Formulation: With patient Time For Goal Achievement: 02/16/19 Potential to Achieve Goals: Good    Frequency Min 3X/week   Barriers to  discharge Decreased caregiver support daughter states she works and mom will be left alone at her house.     Co-evaluation PT/OT/SLP Co-Evaluation/Treatment: Yes Reason for Co-Treatment: For patient/therapist safety PT goals addressed during session: Mobility/safety with mobility         AM-PAC PT "6 Clicks" Mobility  Outcome Measure Help needed turning from your back to your side while in a flat bed without using bedrails?: None Help needed moving from lying on your back to sitting on the side of a flat bed without using bedrails?: None Help needed moving to and from a bed to a chair (including a wheelchair)?: A Little Help needed standing up from a chair using your arms (e.g., wheelchair or bedside chair)?: A Little Help needed to walk in hospital room?: A Little Help needed climbing 3-5 steps with a railing? : A Little 6 Click Score: 20    End of Session Equipment Utilized During Treatment: Gait belt;Oxygen Activity Tolerance: Patient limited by fatigue Patient left: in chair;with call bell/phone within reach;with chair alarm set Nurse Communication: Mobility status PT Visit Diagnosis: Unsteadiness on feet (R26.81);Muscle weakness (generalized) (M62.81)    Time: [id number] PT Time Calculation (min) (ACUTE ONLY): 21 min   Charges:   PT Evaluation $PT Eval Moderate Complexity: 1  Mod          Raney Antwine W,PT Acute Rehabilitation Services Pager:  [phone]  Office:  [place] 02/02/2019, 12:16 PM

## 2019-02-02 NOTE — TOC Progression Note (Addendum)
Transition of Care [facility]) - Progression Note    Patient Details  Name: Fronnie Scheulen MRN: [id number] Date of Birth: Jan 25, 1955  [facility] CM/SW Contact  Myria Steenbergen, Abelino Derrick, RN Phone Number: 02/02/2019, 2:59 PM  Clinical Narrative:   CM spoke with pts daughter post PT eval.  Daughter informed CM that she is aware of the SNF recommendation however she plans to make accommodations in her home and her scheduled so pt can continue to come to her home at discharge but with 24/7 supervision.    CM reiterated with daughter that pt will need transportation to and from HD if pt discharges anywhere but SNF.  Daughter to follow up with CM tomorrow regarding discharge planning  CM was able to locate a [facility] agency in [facility] that have the funds for charity (agency made aware that pt is covid positive) if pt qualifies based on financial information.  [facility] agency [phone] request TOC fax; demographic sheet, H&P, Clinicals and order (CM requested order from attending) to [phone] Carlyon Prows.  Bethena Roys will send financial forms directly to pts daughter [name] at [email or web address].  Daughter is aware that agency will not review for charity until Monday.  IF pt is ready for discharge over weekend - daughter is in agreement to take mom home with her without HH being secured.  CM gave daughter choice for DME agency - daughter chose [employer] - agency contacted agency and referral accepted.       Expected Discharge Plan: Home/Self Care Barriers to Discharge: Barriers Unresolved (comment)(clipping)  Expected Discharge Plan and Services Expected Discharge Plan: Home/Self Care       Living arrangements for the past 2 months: Single Family Home                                       Social Determinants of Health (SDOH) Interventions    Readmission Risk Interventions No flowsheet data found.

## 2019-02-02 NOTE — Progress Notes (Addendum)
PROGRESS NOTE    Tracy Escobar  [id number] DOB: [birth year] DOA: 01/20/2019 PCP: Patient, No Pcp Per   Brief Narrative:  PCCM transfer to [facility] 1/12, Tracy Escobar is a 64/F with no significant past medical history was brought to the emergency room on 1/9 with encephalopathy, decreased p.o. intake for a few days. -The emergency room she was noted to have COVID-19 infection with pneumonia and severe acute kidney injury with a creatinine of 8/BUN of 140 -She was admitted to the ICU, underwent temporary HD catheter placement and temporary dialysis from 1/10 -Also treated with IV remdesivir and Decadron for pneumonia -Nephrology following -Transferred to [facility] service on 1/12 -Since then has required intermittent dialysis and 1 unit of PRBC transfusion -Last HD on 1/18, mild worsening of creatinine since then but urine output slowly improving  Assessment & Plan:  AKI/ATN now may be ESRD Hyperphosphatemia -Admitted with creatinine of 8.6 and BUN of 148, felt to be secondary to Covid, and prerenal component as well as NSAID use(naproxen on med list) -No recent labs in our system, baseline unknown, last creatinine  from 2009 was 1.2 -CT without hydronephrosis -Nephrology consulting, underwent temporary HD catheter placement on 1/9 and dialysis on 1/10, 1/13, 1/15 and 1/18 -Good urine output of 1.7 L in the last 24 hours, mild worsening of creatinine between dialysis -Continue to monitor, no indication for dialysis yet  COVID-19 pneumonia -Imaging on admission consistent with COVID-19 pneumonia -S/p 5 Days of Remdesivir; and 10 days of Decadron -Was also getting CAP coverage in ICU, this was completed -Attempt to wean off O2 as tolerated -Inflammatory markers improving -Wean off O2 as tolerated  Acute metabolic Encephalopathy -Secondary to uremia, COVID-19 pneumonia -Resolved  Normocytic Anemia -Hemoglobin has gradually trended down from 9-10 range to 6.7 few days ago  -Transfused 1  unit of PRBC this admission  -Anemia panel suggestive of chronic disease, no overt bleeding noted  -Continue EPO -Hemoglobin down to 7.8, monitor  Hyperkalemia   -Resolved with HD  Hyperglycemia Diabetes Mellitus Type 2 - Hemoglobin A1c was 6.7 -CBG stable, continue sliding scale  Obesity  -Estimated body mass index is 31.98 kg/m as calculated from the following:   Height as of this encounter: 5\' 2"  (1.575 m).   Weight as of this encounter: 79.3 kg. -Weight Loss and Dietary Counseling   HTN -C/w Amlodipine 5 mg po Daily   DVT prophylaxis: Heparin 5,000 units sq q8h Code Status: Full code Family Communication: No family at bedside disposition Plan: Pending Improvement in Renal Fxn  Consultants:   Nephrology  Procedures: Dialysis   Antimicrobials:  Anti-infectives (From admission, onward)   Start     Dose/Rate Route Frequency Ordered Stop   01/22/19 1000  remdesivir 100 mg in sodium chloride 0.9 % 100 mL IVPB     100 mg 200 mL/hr over 30 Minutes Intravenous Daily 01/21/19 0552 01/25/19 1900   01/21/19 0800  azithromycin (ZITHROMAX) 500 mg in sodium chloride 0.9 % 250 mL IVPB     500 mg 250 mL/hr over 60 Minutes Intravenous Every 24 hours 01/21/19 0559 01/25/19 2025   01/21/19 0630  remdesivir 200 mg in sodium chloride 0.9% 250 mL IVPB     200 mg 580 mL/hr over 30 Minutes Intravenous Once 01/21/19 0552 01/21/19 0930   01/21/19 0600  cefTRIAXone (ROCEPHIN) 1 g in sodium chloride 0.9 % 100 mL IVPB     1 g 200 mL/hr over 30 Minutes Intravenous Every 24 hours 01/21/19 0559 01/25/19 0539  Subjective: -No events overnight, denies any nausea vomiting, swelling or shortness of breath  Objective: Vitals:   02/02/19 0002 02/02/19 0003 02/02/19 0400 02/02/19 0907  BP: (!) 148/70 (!) 148/70 (!) 148/62 (!) 158/64  Pulse: 60 (!) 54 (!) 59 80  Resp: 20 18 18  (!) 23  Temp: 98 F (36.7 C) 98.6 F (37 C) 98 F (36.7 C) 99 F (37.2 C)  TempSrc:  Oral Oral Oral  SpO2:  98% 99% 95% 96%  Weight:      Height:        Intake/Output Summary (Last 24 hours) at 02/02/2019 1324 Last data filed at 02/02/2019 0600 Gross per 24 hour  Intake 580 ml  Output 1000 ml  Net -420 ml   Filed Weights   01/30/19 0928 01/30/19 1248 01/31/19 0650  Weight: 72 kg 71 kg 79.3 kg   Examination: Physical Exam: Gen: Pleasant female sitting up in bed AAOx3, no distress HEENT: No JVD  lungs: Clear CVS: RRR,No Gallops,Rubs or new Murmurs Abd: soft, Non tender, non distended, BS present Extremities: No edema Skin: no new rashes    Data Reviewed: I have personally reviewed following labs and imaging studies  CBC: Recent Labs  Lab 01/28/19 0511 01/28/19 0511 01/29/19 0259 01/30/19 0707 01/31/19 0653 02/01/19 0510 02/02/19 0514  WBC 14.6*   < > 15.7* 16.8* 13.5* 11.6* 9.4  NEUTROABS 11.2*  --  13.0* 13.8* 10.1* 9.1*  --   HGB 7.3*   < > 6.7* 8.7* 8.2* 8.0* 7.8*  HCT 22.1*   < > 20.5* 26.5* 25.2* 26.3* 26.0*  MCV 83.4   < > 84.7 83.6 85.7 89.8 91.5  PLT 347   < > 335 236 323 337 259   < > = values in this interval not displayed.   Basic Metabolic Panel: Recent Labs  Lab 01/28/19 0511 01/28/19 0511 01/29/19 0259 01/30/19 0707 01/31/19 0653 02/01/19 0510 02/02/19 0514  NA 137   < > 136 137 141 139 146*  K 3.8   < > 3.7 3.7 3.4* 3.7 4.2  CL 98   < > 99 99 105 103 108  CO2 26   < > 24 23 27 23 24   GLUCOSE 98   < > 154* 163* 99 127* 95  BUN 70*   < > 80* 92* 47* 67* 76*  CREATININE 6.47*   < > 7.36* 8.22* 5.02* 5.89* 6.11*  CALCIUM 7.8*   < > 7.8* 8.1* 7.7* 7.9* 8.1*  MG 1.8  --  1.9 2.0 1.7 1.7  --   PHOS 7.9*   < > 8.6* 8.7* 6.1* 5.7* 6.4*   < > = values in this interval not displayed.   GFR: Estimated Creatinine Clearance: 9.3 mL/min (A) (by C-G formula based on SCr of 6.11 mg/dL (H)). Liver Function Tests: Recent Labs  Lab 01/28/19 0511 01/28/19 0511 01/29/19 0259 01/30/19 0707 01/31/19 0653 02/01/19 0510 02/02/19 0514  AST 24  --  19 19 17   14*  --   ALT 26  --  27 28 27 26   --   ALKPHOS 30*  --  34* 37* 33* 32*  --   BILITOT 0.3  --  0.5 0.4 0.3 0.3  --   PROT 4.7*  --  4.6* 5.3* 4.7* 4.8*  --   ALBUMIN 1.5*   < > 1.6* 1.6* 1.5* 1.6* 1.6*   < > = values in this interval not displayed.   No results for input(s): LIPASE, AMYLASE in  the last 168 hours. No results for input(s): AMMONIA in the last 168 hours. Coagulation Profile: No results for input(s): INR, PROTIME in the last 168 hours. Cardiac Enzymes: No results for input(s): CKTOTAL, CKMB, CKMBINDEX, TROPONINI in the last 168 hours. BNP (last 3 results) No results for input(s): PROBNP in the last 8760 hours. HbA1C: No results for input(s): HGBA1C in the last 72 hours. CBG: Recent Labs  Lab 02/01/19 1230 02/01/19 1645 02/01/19 2335 02/02/19 0808 02/02/19 1134  GLUCAP 87 111* 122* 87 80   Lipid Profile: No results for input(s): CHOL, HDL, LDLCALC, TRIG, CHOLHDL, LDLDIRECT in the last 72 hours. Thyroid Function Tests: No results for input(s): TSH, T4TOTAL, FREET4, T3FREE, THYROIDAB in the last 72 hours. Anemia Panel: Recent Labs    02/01/19 0510 02/02/19 0514  FERRITIN 201 186   Sepsis Labs: Recent Labs  Lab 01/29/19 0259 01/30/19 0707  PROCALCITON 0.82 0.57    No results found for this or any previous visit (from the past 240 hour(s)).  Radiology Studies: DG CHEST PORT 1 VIEW  Result Date: 02/01/2019 CLINICAL DATA:  64 year old female with history of shortness of breath. COVID-19 positive. EXAM: PORTABLE CHEST 1 VIEW COMPARISON:  Chest x-ray 01/29/2019. FINDINGS: There is a right-sided internal jugular central venous catheter with tip terminating in the mid superior vena cava. Patchy multifocal ill-defined opacities and areas of interstitial prominence again noted throughout the lungs bilaterally. No pleural effusions. No evidence of pulmonary edema. Heart size is upper limits of normal. Upper mediastinal contours are within normal limits. IMPRESSION:  1. Support apparatus, as above. 2. Multilobar bilateral pneumonia with similar aeration compared to the prior examination, as above. Electronically Signed   By: Vinnie Langton M.D.   On: 02/01/2019 08:27    Scheduled Meds: . amLODipine  5 mg Oral Daily  . Chlorhexidine Gluconate Cloth  6 each Topical Q0600  . darbepoetin (ARANESP) injection - NON-DIALYSIS  100 mcg Subcutaneous Q Wed-1800  . heparin  7,500 Units Subcutaneous Q8H  . influenza vac split quadrivalent PF  0.5 mL Intramuscular Tomorrow-1000  . insulin aspart  0-9 Units Subcutaneous TID WC  . pantoprazole  40 mg Oral Q1200   Continuous Infusions: . sodium chloride    . sodium chloride       LOS: 12 days   Domenic Polite, MD [facility]

## 2019-02-03 LAB — RENAL FUNCTION PANEL
Albumin: 2 g/dL — ABNORMAL LOW (ref 3.5–5.0)
Anion gap: 12 (ref 5–15)
BUN: 77 mg/dL — ABNORMAL HIGH (ref 8–23)
CO2: 21 mmol/L — ABNORMAL LOW (ref 22–32)
Calcium: 8.4 mg/dL — ABNORMAL LOW (ref 8.9–10.3)
Chloride: 108 mmol/L (ref 98–111)
Creatinine, Ser: 5.79 mg/dL — ABNORMAL HIGH (ref 0.44–1.00)
GFR calc Af Amer: 8 mL/min — ABNORMAL LOW (ref >60)
GFR calc non Af Amer: 7 mL/min — ABNORMAL LOW (ref >60)
Glucose, Bld: 109 mg/dL — ABNORMAL HIGH (ref 70–99)
Phosphorus: 5.2 mg/dL — ABNORMAL HIGH (ref 2.5–4.6)
Potassium: 3.3 mmol/L — ABNORMAL LOW (ref 3.5–5.1)
Sodium: 141 mmol/L (ref 135–145)

## 2019-02-03 LAB — GLUCOSE, CAPILLARY
Glucose-Capillary: 109 mg/dL — ABNORMAL HIGH (ref 70–99)
Glucose-Capillary: 119 mg/dL — ABNORMAL HIGH (ref 70–99)
Glucose-Capillary: 110 mg/dL — ABNORMAL HIGH (ref 70–99)
Glucose-Capillary: 108 mg/dL — ABNORMAL HIGH (ref 70–99)

## 2019-02-03 LAB — CBC
HCT: 28.5 % — ABNORMAL LOW (ref 36.0–46.0)
Hemoglobin: 8.6 g/dL — ABNORMAL LOW (ref 12.0–15.0)
MCH: 27.3 pg (ref 26.0–34.0)
MCHC: 30.2 g/dL (ref 30.0–36.0)
MCV: 90.5 fL (ref 80.0–100.0)
Platelets: 277 10*3/uL (ref 150–400)
RBC: 3.15 MIL/uL — ABNORMAL LOW (ref 3.87–5.11)
RDW: 17.8 % — ABNORMAL HIGH (ref 11.5–15.5)
WBC: 10.3 10*3/uL (ref 4.0–10.5)
nRBC: 0.2 % (ref 0.0–0.2)

## 2019-02-03 LAB — C-REACTIVE PROTEIN: CRP: 1.4 mg/dL — ABNORMAL HIGH (ref <1.0)

## 2019-02-03 LAB — D-DIMER, QUANTITATIVE: D-Dimer, Quant: 3.14 ug/mL-FEU — ABNORMAL HIGH (ref 0.00–0.50)

## 2019-02-03 LAB — FERRITIN: Ferritin: 179 ng/mL (ref 11–307)

## 2019-02-03 MED ORDER — HYDROMORPHONE HCL 1 MG/ML IJ SOLN
1.0000 mg | INTRAMUSCULAR | Status: AC | PRN
  Administered 2019-02-03 (×2): 1 mg via INTRAVENOUS
  Filled 2019-02-03 (×2): qty 1

## 2019-02-03 MED ORDER — POTASSIUM CHLORIDE CRYS ER 20 MEQ PO TBCR
20.0000 meq | EXTENDED_RELEASE_TABLET | Freq: Once | ORAL | Status: AC
  Administered 2019-02-03: 13:00:00 20 meq via ORAL
  Filled 2019-02-03: qty 1

## 2019-02-03 NOTE — Plan of Care (Signed)

## 2019-02-03 NOTE — Progress Notes (Signed)
PROGRESS NOTE    Tracy Escobar  [id number] DOB: Jun 25, 1955 DOA: 01/20/2019 PCP: Tracy Escobar   Brief Narrative:  PCCM transfer to [facility] 1/12, Tracy Escobar is a 63/F with no significant past medical history was brought to the emergency room on 1/9 with encephalopathy, decreased p.o. intake for a few days. 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 Hyperphosphatemia -Admitted with creatinine of 8.6 and BUN of 148, felt to be secondary to Covid, and prerenal component as well as NSAID use(naproxen on med list) -No recent labs in our system, baseline unknown, last creatinine  from 2009 was 1.2 -CT without hydronephrosis -Nephrology consulting, underwent temporary HD catheter placement on 1/9 and dialysis on 1/10, 1/13, 1/15 and 1/18 -Mild improvement in kidney function but robust urine output yesterday -Continue to monitor for renal recovery, follow be meant, urine output closely  COVID-19 pneumonia -Imaging on admission consistent with COVID-19 pneumonia -S/p 5 Days of Remdesivir; and 10 days of Decadron -Was also getting CAP coverage in ICU, this was completed -Attempt to wean off O2 as tolerated -Inflammatory markers improving -Wean off O2 as tolerated  Acute metabolic Encephalopathy -Secondary to uremia, COVID-19 pneumonia -Resolved  Normocytic Anemia -Hemoglobin has gradually trended down from 9-10 range to 6.7 few days ago  -Transfused 1  unit of PRBC this admission  -Anemia panel suggestive of chronic disease, no overt bleeding noted  -Continue EPO -Hemoglobin down to 7.8, monitor  Hyperkalemia   -Resolved with HD  Hyperglycemia Diabetes Mellitus Type 2 - Hemoglobin A1c was 6.7 -CBG stable, continue sliding scale  Obesity  -Estimated body mass index is 31.98 kg/m as calculated from the following:   Height as of this encounter: 5\' 2"  (1.575 m).   Weight as of this encounter: 79.3 kg. -Weight Loss and Dietary Counseling   HTN -C/w Amlodipine 5 mg po Daily   DVT prophylaxis: Heparin 5,000 units sq q8h Code Status: Full code Family Communication: No family at bedside disposition Plan: Pending Improvement in Renal Fxn, ? 2-3days  Consultants:   Nephrology  Procedures: Dialysis   Antimicrobials:  Anti-infectives (From admission, onward)   Start     Dose/Rate Route Frequency Ordered Stop   01/22/19 1000  remdesivir 100 mg in sodium chloride 0.9 % 100 mL IVPB     100 mg 200 mL/hr over 30 Minutes Intravenous Daily 01/21/19 0552 01/25/19 1900   01/21/19 0800  azithromycin (ZITHROMAX) 500 mg in sodium chloride 0.9 % 250 mL IVPB     500 mg 250 mL/hr over 60 Minutes Intravenous Every 24 hours 01/21/19 0559 01/25/19 2025   01/21/19 0630  remdesivir 200 mg in sodium chloride 0.9% 250 mL IVPB     200 mg 580 mL/hr over 30 Minutes Intravenous Once 01/21/19 0552 01/21/19 0930   01/21/19 0600  cefTRIAXone (ROCEPHIN) 1 g in sodium chloride 0.9 % 100 mL IVPB     1 g 200 mL/hr over 30 Minutes Intravenous Every 24 hours 01/21/19 0559 01/25/19 0539  Subjective: -c/o headache, no other complaints  Objective: Vitals:   02/03/19 0400 02/03/19 0900 02/03/19 0955 02/03/19 0957  BP: (!) 150/78 (!) 176/81 (!) 156/76 (!) 160/76  Pulse: 74 92 69   Resp: 20 20 15    Temp: 98.6 F (37 C)     TempSrc: Oral     SpO2: 93% 98%    Weight:      Height:        Intake/Output Summary (Last 24 hours) at 02/03/2019 1240 Last  data filed at 02/02/2019 2200 Gross Escobar 24 hour  Intake 600 ml  Output 2150 ml  Net -1550 ml   Filed Weights   01/30/19 0928 01/30/19 1248 01/31/19 0650  Weight: 72 kg 71 kg 79.3 kg   Examination: Physical Exam:  Gen: Awake, Alert, Oriented X 3, no distress HEENT: no JVD Lungs: CTAB CVS: S1S2/RRR Abd: soft, Non tender, non distended, BS present Extremities: No edema Skin: no new rashes    Data Reviewed: I have personally reviewed following labs and imaging studies  CBC: Recent Labs  Lab 01/28/19 0511 01/28/19 0511 01/29/19 0259 01/29/19 0259 01/30/19 0707 01/31/19 0653 02/01/19 0510 02/02/19 0514 02/03/19 0757  WBC 14.6*   < > 15.7*   < > 16.8* 13.5* 11.6* 9.4 10.3  NEUTROABS 11.2*  --  13.0*  --  13.8* 10.1* 9.1*  --   --   HGB 7.3*   < > 6.7*   < > 8.7* 8.2* 8.0* 7.8* 8.6*  HCT 22.1*   < > 20.5*   < > 26.5* 25.2* 26.3* 26.0* 28.5*  MCV 83.4   < > 84.7   < > 83.6 85.7 89.8 91.5 90.5  PLT 347   < > 335   < > 236 323 337 259 277   < > = values in this interval not displayed.   Basic Metabolic Panel: Recent Labs  Lab 01/28/19 0511 01/28/19 0511 01/29/19 0259 01/29/19 0259 01/30/19 0707 01/31/19 0653 02/01/19 0510 02/02/19 0514 02/03/19 0307  NA 137   < > 136   < > 137 141 139 146* 141  K 3.8   < > 3.7   < > 3.7 3.4* 3.7 4.2 3.3*  CL 98   < > 99   < > 99 105 103 108 108  CO2 26   < > 24   < > 23 27 23 24  21*  GLUCOSE 98   < > 154*   < > 163* 99 127* 95 109*  BUN 70*   < > 80*   < > 92* 47* 67* 76* 77*  CREATININE 6.47*   < > 7.36*   < > 8.22* 5.02* 5.89* 6.11* 5.79*  CALCIUM 7.8*   < > 7.8*   < > 8.1* 7.7* 7.9* 8.1* 8.4*  MG 1.8  --  1.9  --  2.0 1.7 1.7  --   --   PHOS 7.9*   < > 8.6*   < > 8.7* 6.1* 5.7* 6.4* 5.2*   < > = values in this interval not displayed.   GFR: Estimated Creatinine Clearance: 9.8 mL/min (A) (by C-G formula based on SCr of 5.79 mg/dL (H)). Liver Function Tests: Recent Labs  Lab 01/28/19 0511 01/28/19 0511 01/29/19 0259  01/29/19 0259 01/30/19 0707 01/31/19 0653 02/01/19 0510 02/02/19 0514 02/03/19 0307  AST 24  --  19  --  19 17 14*  --   --   ALT 26  --  27  --  28 27 26   --   --   ALKPHOS 30*  --  34*  --  37* 33* 32*  --   --   BILITOT 0.3  --  0.5  --  0.4 0.3 0.3  --   --   PROT 4.7*  --  4.6*  --  5.3* 4.7* 4.8*  --   --   ALBUMIN 1.5*   < > 1.6*   < > 1.6* 1.5* 1.6* 1.6* 2.0*   < > = values in this interval not displayed.   No results for input(s): LIPASE, AMYLASE in the last 168 hours. No results for input(s): AMMONIA in the last 168 hours. Coagulation Profile: No results for input(s): INR, PROTIME in the last 168 hours. Cardiac Enzymes: No results for input(s): CKTOTAL, CKMB, CKMBINDEX, TROPONINI in the last 168 hours. BNP (last 3 results) No results for input(s): PROBNP in the last 8760 hours. HbA1C: No results for input(s): HGBA1C in the last 72 hours. CBG: Recent Labs  Lab 02/02/19 0808 02/02/19 1134 02/02/19 1632 02/03/19 0741 02/03/19 1130  GLUCAP 87 80 118* 109* 119*   Lipid Profile: No results for input(s): CHOL, HDL, LDLCALC, TRIG, CHOLHDL, LDLDIRECT in the last 72 hours. Thyroid Function Tests: No results for input(s): TSH, T4TOTAL, FREET4, T3FREE, THYROIDAB in the last 72 hours. Anemia Panel: Recent Labs    02/02/19 0514 02/03/19 0307  FERRITIN 186 179   Sepsis Labs: Recent Labs  Lab 01/29/19 0259 01/30/19 0707  PROCALCITON 0.82 0.57    No results found for this or any previous visit (from the past 240 hour(s)).  Radiology Studies: No results found.  Scheduled Meds: . amLODipine  5 mg Oral Daily  . Chlorhexidine Gluconate Cloth  6 each Topical Q0600  . darbepoetin (ARANESP) injection - NON-DIALYSIS  100 mcg Subcutaneous Q Wed-1800  . heparin  7,500 Units Subcutaneous Q8H  . influenza vac split quadrivalent PF  0.5 mL Intramuscular Tomorrow-1000  . insulin aspart  0-9 Units Subcutaneous TID WC  . pantoprazole  40 mg Oral Q1200  . potassium chloride   20 mEq Oral Once   Continuous Infusions: . sodium chloride    . sodium chloride       LOS: 13 days   Domenic Polite, MD [facility]

## 2019-02-03 NOTE — Progress Notes (Signed)
Subjective:    Creatinine improved to 5.8, BUN 77, K3.3  2.9 L urine output  Still has right IJ temporary HD catheter  Patient without complaint  On room air   Objective Vital signs in last 24 hours: Vitals:   02/03/19 0300 02/03/19 0400 02/03/19 0900 02/03/19 0957  BP:  (!) 150/78 (!) 176/81 (!) 160/76  Pulse: 88 74 92   Resp: 20 20 20    Temp:  98.6 F (37 C)    TempSrc:  Oral    SpO2: 92% 93% 98%   Weight:      Height:       Weight change:   Intake/Output Summary (Last 24 hours) at 02/03/2019 1124 Last data filed at 02/02/2019 2200 Gross per 24 hour  Intake 600 ml  Output 2150 ml  Net -1550 ml    Assessment/ Plan: Pt is a 64 y.o. yo female with HTN baseline renal function unknown (was 1.2 in 2009 but no more recent data) who was admitted on 01/20/2019 with  COVID and crt of 8 and BUN well over 100 Assessment/Plan: 1. Renal-  Unknown renal function baseline.  Presented on 1/10 with crt of 8 and high BUN necessitating HD. Strong evidence today that she is recovering GFR given increased UOP and downtrending serum creatinine. I think we can remove her temporary HD catheter today. If continues to improve tomorrow, probably safe for discharge with close follow-up, she will be moving to the [facility] area so they will need to establish primary care quickly.  2. HTN/vol-  BP normal to high-  Seems relatively euvolemic 3. Anemia- Cont ESA, transfuse prn 4. Covid-  has finished and steroids remdesivir- on room air per [place]: Basic Metabolic Panel: Recent Labs  Lab 02/01/19 0510 02/02/19 0514 02/03/19 0307  NA 139 146* 141  K 3.7 4.2 3.3*  CL 103 108 108  CO2 23 24 21*  GLUCOSE 127* 95 109*  BUN 67* 76* 77*  CREATININE 5.89* 6.11* 5.79*  CALCIUM 7.9* 8.1* 8.4*  PHOS 5.7* 6.4* 5.2*   Liver Function Tests: Recent Labs  Lab 01/30/19 0707 01/30/19 0707 01/31/19 0653 01/31/19 0653 02/01/19 0510 02/02/19 0514 02/03/19 0307  AST 19  --   17  --  14*  --   --   ALT 28  --  27  --  26  --   --   ALKPHOS 37*  --  33*  --  32*  --   --   BILITOT 0.4  --  0.3  --  0.3  --   --   PROT 5.3*  --  4.7*  --  4.8*  --   --   ALBUMIN 1.6*   < > 1.5*   < > 1.6* 1.6* 2.0*   < > = values in this interval not displayed.   No results for input(s): LIPASE, AMYLASE in the last 168 hours. No results for input(s): AMMONIA in the last 168 hours. CBC: Recent Labs  Lab 01/30/19 0707 01/30/19 0707 01/31/19 0653 01/31/19 0653 02/01/19 0510 02/02/19 0514 02/03/19 0757  WBC 16.8*   < > 13.5*   < > 11.6* 9.4 10.3  NEUTROABS 13.8*  --  10.1*  --  9.1*  --   --   HGB 8.7*   < > 8.2*   < > 8.0* 7.8* 8.6*  HCT 26.5*   < > 25.2*   < > 26.3* 26.0* 28.5*  MCV  83.6  --  85.7  --  89.8 91.5 90.5  PLT 236   < > 323   < > 337 259 277   < > = values in this interval not displayed.   Cardiac Enzymes: No results for input(s): CKTOTAL, CKMB, CKMBINDEX, TROPONINI in the last 168 hours. CBG: Recent Labs  Lab 02/01/19 2335 02/02/19 0808 02/02/19 1134 02/02/19 1632 02/03/19 0741  GLUCAP 122* 87 80 118* 109*    Iron Studies:  Recent Labs    02/03/19 0307  FERRITIN 179   Studies/Results: No results found. Medications: Infusions: . sodium chloride    . sodium chloride      Scheduled Medications: . amLODipine  5 mg Oral Daily  . Chlorhexidine Gluconate Cloth  6 each Topical Q0600  . darbepoetin (ARANESP) injection - NON-DIALYSIS  100 mcg Subcutaneous Q Wed-1800  . heparin  7,500 Units Subcutaneous Q8H  . influenza vac split quadrivalent PF  0.5 mL Intramuscular Tomorrow-1000  . insulin aspart  0-9 Units Subcutaneous TID WC  . pantoprazole  40 mg Oral Q1200    have reviewed scheduled and prn medications.  Physical Exam: Gen-  NAD, pleasant HEENT-  eomi Lungs-   Ant clear CV-  RRR abd-  Soft, non tender Ext-  No sig edema R IJ Temp HD cath c/d/i  02/03/2019,11:24 AM  LOS: 13 days

## 2019-02-04 LAB — FERRITIN: Ferritin: 154 ng/mL (ref 11–307)

## 2019-02-04 LAB — RENAL FUNCTION PANEL
Albumin: 2 g/dL — ABNORMAL LOW (ref 3.5–5.0)
Anion gap: 12 (ref 5–15)
BUN: 68 mg/dL — ABNORMAL HIGH (ref 8–23)
CO2: 23 mmol/L (ref 22–32)
Calcium: 8.3 mg/dL — ABNORMAL LOW (ref 8.9–10.3)
Chloride: 105 mmol/L (ref 98–111)
Creatinine, Ser: 5.71 mg/dL — ABNORMAL HIGH (ref 0.44–1.00)
GFR calc Af Amer: 9 mL/min — ABNORMAL LOW (ref >60)
GFR calc non Af Amer: 7 mL/min — ABNORMAL LOW (ref >60)
Glucose, Bld: 121 mg/dL — ABNORMAL HIGH (ref 70–99)
Phosphorus: 6.2 mg/dL — ABNORMAL HIGH (ref 2.5–4.6)
Potassium: 3.5 mmol/L (ref 3.5–5.1)
Sodium: 140 mmol/L (ref 135–145)

## 2019-02-04 LAB — CBC
HCT: 28 % — ABNORMAL LOW (ref 36.0–46.0)
Hemoglobin: 8.6 g/dL — ABNORMAL LOW (ref 12.0–15.0)
MCH: 27.7 pg (ref 26.0–34.0)
MCHC: 30.7 g/dL (ref 30.0–36.0)
MCV: 90 fL (ref 80.0–100.0)
Platelets: 297 10*3/uL (ref 150–400)
RBC: 3.11 MIL/uL — ABNORMAL LOW (ref 3.87–5.11)
RDW: 17.4 % — ABNORMAL HIGH (ref 11.5–15.5)
WBC: 11 10*3/uL — ABNORMAL HIGH (ref 4.0–10.5)
nRBC: 0 % (ref 0.0–0.2)

## 2019-02-04 LAB — GLUCOSE, CAPILLARY
Glucose-Capillary: 93 mg/dL (ref 70–99)
Glucose-Capillary: 87 mg/dL (ref 70–99)
Glucose-Capillary: 108 mg/dL — ABNORMAL HIGH (ref 70–99)
Glucose-Capillary: 163 mg/dL — ABNORMAL HIGH (ref 70–99)

## 2019-02-04 LAB — C-REACTIVE PROTEIN: CRP: 3.6 mg/dL — ABNORMAL HIGH (ref <1.0)

## 2019-02-04 LAB — D-DIMER, QUANTITATIVE: D-Dimer, Quant: 2.19 ug/mL-FEU — ABNORMAL HIGH (ref 0.00–0.50)

## 2019-02-04 MED ORDER — AMLODIPINE BESYLATE 10 MG PO TABS
10.0000 mg | ORAL_TABLET | Freq: Every day | ORAL | Status: DC
  Administered 2019-02-04 – 2019-02-05 (×2): 10 mg via ORAL
  Filled 2019-02-04 (×2): qty 1

## 2019-02-04 MED ORDER — ACETAMINOPHEN 500 MG PO TABS
1000.0000 mg | ORAL_TABLET | Freq: Once | ORAL | Status: AC
  Administered 2019-02-04: 22:00:00 1000 mg via ORAL
  Filled 2019-02-04: qty 2

## 2019-02-04 MED ORDER — HYDROMORPHONE HCL 1 MG/ML IJ SOLN
1.0000 mg | Freq: Once | INTRAMUSCULAR | Status: AC
  Administered 2019-02-04: 03:00:00 1 mg via INTRAVENOUS
  Filled 2019-02-04: qty 1

## 2019-02-04 NOTE — Progress Notes (Signed)
Subjective:    Creatinine continues to slowly improve, BUN as well  K3.5, HCO3 23  1.9 L urine output  HD catheter removed uneventfully yesterday  Blood pressures are running on the high side    Objective Vital signs in last 24 hours: Vitals:   02/04/19 0014 02/04/19 0400 02/04/19 0500 02/04/19 0800  BP: (!) 173/78 (!) 152/62  (!) 153/75  Pulse: 83 83 81 71  Resp: 20  (!) 21 19  Temp: 99.2 F (37.3 C) 98.8 F (37.1 C)  98.2 F (36.8 C)  TempSrc: Axillary Oral  Oral  SpO2: 91% 94% (!) 84% 92%  Weight:      Height:       Weight change:   Intake/Output Summary (Last 24 hours) at 02/04/2019 1116 Last data filed at 02/04/2019 1022 Gross per 24 hour  Intake 860 ml  Output 2200 ml  Net -1340 ml    Assessment/ Plan: Pt is a 64 y.o. yo female with HTN baseline renal function unknown (was 1.2 in 2009 but no more recent data) who was admitted on 01/20/2019 with  COVID and crt of 8 and BUN well over 100 Assessment/Plan: 1. Renal-  Unknown renal function baseline.  Presented on 1/10 with crt of 8 and high BUN necessitating HD.  Ongoing strong evidence today that she is recovering sufficient GFR to not require dialysis.  At this point, I think we are okay for discharge and she will need to closely follow-up with primary care and establish care with nephrology in the [place] region. 2. HTN/vol-  BP normal to high-  Seems relatively euvolemic, increase amlodipine to 10 mg 3. Anemia- Cont ESA, transfuse prn, stable 4. Covid-  has finished and steroids remdesivir- on room air per [facility]  Will sign off for now.  Please call with any questions or concerns.   [place]: Basic Metabolic Panel: Recent Labs  Lab 02/02/19 0514 02/03/19 0307 02/04/19 0324  NA 146* 141 140  K 4.2 3.3* 3.5  CL 108 108 105  CO2 24 21* 23  GLUCOSE 95 109* 121*  BUN 76* 77* 68*  CREATININE 6.11* 5.79* 5.71*  CALCIUM 8.1* 8.4* 8.3*  PHOS 6.4* 5.2* 6.2*   Liver Function Tests: Recent  Labs  Lab 01/30/19 0707 01/30/19 0707 01/31/19 0653 01/31/19 0653 02/01/19 0510 02/01/19 0510 02/02/19 0514 02/03/19 0307 02/04/19 0324  AST 19  --  17  --  14*  --   --   --   --   ALT 28  --  27  --  26  --   --   --   --   ALKPHOS 37*  --  33*  --  32*  --   --   --   --   BILITOT 0.4  --  0.3  --  0.3  --   --   --   --   PROT 5.3*  --  4.7*  --  4.8*  --   --   --   --   ALBUMIN 1.6*   < > 1.5*   < > 1.6*   < > 1.6* 2.0* 2.0*   < > = values in this interval not displayed.   No results for input(s): LIPASE, AMYLASE in the last 168 hours. No results for input(s): AMMONIA in the last 168 hours. CBC: Recent Labs  Lab 01/30/19 [id number] 01/30/19 [id number] 01/31/19 [id number] 01/31/19 [id number] 02/01/19 0510 02/01/19 0510 02/02/19 [id number] 02/03/19 0757  02/04/19 0324  WBC 16.8*   < > 13.5*   < > 11.6*   < > 9.4 10.3 11.0*  NEUTROABS 13.8*  --  10.1*  --  9.1*  --   --   --   --   HGB 8.7*   < > 8.2*   < > 8.0*   < > 7.8* 8.6* 8.6*  HCT 26.5*   < > 25.2*   < > 26.3*   < > 26.0* 28.5* 28.0*  MCV 83.6   < > 85.7  --  89.8  --  91.5 90.5 90.0  PLT 236   < > 323   < > 337   < > 259 277 297   < > = values in this interval not displayed.   Cardiac Enzymes: No results for input(s): CKTOTAL, CKMB, CKMBINDEX, TROPONINI in the last 168 hours. CBG: Recent Labs  Lab 02/03/19 0741 02/03/19 1130 02/03/19 1647 02/03/19 2119 02/04/19 0805  GLUCAP 109* 119* 110* 108* 93    Iron Studies:  Recent Labs    02/04/19 0324  FERRITIN 154   Studies/Results: No results found. Medications: Infusions: . sodium chloride    . sodium chloride      Scheduled Medications: . amLODipine  10 mg Oral Daily  . Chlorhexidine Gluconate Cloth  6 each Topical Q0600  . darbepoetin (ARANESP) injection - NON-DIALYSIS  100 mcg Subcutaneous Q Wed-1800  . heparin  7,500 Units Subcutaneous Q8H  . influenza vac split quadrivalent PF  0.5 mL Intramuscular Tomorrow-1000  . insulin aspart  0-9 Units Subcutaneous TID WC  .  pantoprazole  40 mg Oral Q1200    have reviewed scheduled and prn medications.  Physical Exam: Gen-  NAD, pleasant HEENT-  eomi Lungs-   Ant clear CV-  RRR abd-  Soft, non tender Ext-  No sig edema Bandage at previous right IJ temp cath site  02/04/2019,11:16 AM  LOS: 14 days

## 2019-02-04 NOTE — Progress Notes (Signed)
Paged Dr. Kennon Holter, patient complaining of headache 8 out 10 and her sbp is up 173/78, patient is requesting something for pain she did receive a dose of 1mg  of diluadid IV one time dose, day shift for her headache... Dr. Kennon Holter has just put in an order for patient to get a one time dose of diluadid IV at 0245 am; will recheck patients bp after pain med given.

## 2019-02-04 NOTE — Progress Notes (Signed)
PROGRESS NOTE    Brittian Sawin  [id number] DOB: 1955-08-08 DOA: 01/20/2019 PCP: Patient, No Pcp Per   Brief Narrative:  PCCM transfer to [facility] 1/12, [name] is a 63/F with no significant past medical history was brought to the emergency room on 1/9 with encephalopathy, decreased p.o. intake for a few days. 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 Hyperphosphatemia -Admitted with creatinine of 8.6 and BUN of 148, felt to be secondary to Covid, and prerenal component as well as NSAID use(naproxen on med list) -No recent labs in our system, baseline unknown, last creatinine  from 2009 was 1.2 -CT without hydronephrosis -Nephrology consulting, underwent temporary HD catheter placement on 1/9 and dialysis on 1/10, 1/13, 1/15 and 1/18 -Mild improvement in kidney function over the last few days but continues to have good brisk urine output without other symptoms of uremia -HD catheter removed yesterday -Discharge planning -Will need close follow-up with PCP and nephrologist in [place]  COVID-19 pneumonia -Imaging on admission consistent with COVID-19 pneumonia -S/p 5 Days of Remdesivir; and 10 days of Decadron -Was also getting CAP coverage in ICU, this was completed -Attempt to wean off O2 as tolerated -Inflammatory markers improving -Wean off O2 as tolerated  Acute metabolic Encephalopathy -Secondary to uremia, COVID-19  pneumonia -Resolved  Normocytic Anemia -Hemoglobin has gradually trended down from 9-10 range to 6.7 few days ago  -Transfused 1 unit of PRBC this admission  -Anemia panel suggestive of chronic disease, no overt bleeding noted  -Continue EPO -Hemoglobin down to 7.8, monitor  Hyperkalemia   -Resolved with HD  Hyperglycemia Diabetes Mellitus Type 2 - Hemoglobin A1c was 6.7 -CBG stable, continue sliding scale  Obesity  -Estimated body mass index is 31.98 kg/m as calculated from the following:   Height as of this encounter: 5\' 2"  (1.575 m).   Weight as of this encounter: 79.3 kg. -Weight Loss and Dietary Counseling   HTN -C/w Amlodipine 5 mg po Daily   DVT prophylaxis: Heparin 5,000 units sq q8h Code Status: Full code Family Communication: No family at bedside disposition Plan: Home tomorrow as long as creatinine does not worsen  Consultants:   Nephrology  Procedures: Dialysis   Antimicrobials:  Anti-infectives (From admission, onward)   Start     Dose/Rate Route Frequency Ordered Stop   01/22/19 1000  remdesivir 100 mg in sodium chloride 0.9 % 100 mL IVPB     100 mg 200 mL/hr over 30 Minutes Intravenous Daily 01/21/19 0552 01/25/19 1900   01/21/19 0800  azithromycin (ZITHROMAX) 500 mg in sodium chloride 0.9 % 250 mL IVPB     500 mg 250 mL/hr over 60 Minutes Intravenous Every 24 hours 01/21/19 0559 01/25/19 2025   01/21/19 0630  remdesivir 200 mg in sodium chloride 0.9% 250 mL IVPB     200 mg 580 mL/hr over 30 Minutes Intravenous Once 01/21/19 0552 01/21/19 0930   01/21/19 0600  cefTRIAXone (ROCEPHIN) 1 g in sodium chloride 0.9 % 100 mL IVPB  1 g 200 mL/hr over 30 Minutes Intravenous Every 24 hours 01/21/19 0559 01/25/19 0539     Subjective: -Had a headache last night, feels better today, mild anorexia but no nausea vomiting, denies any dyspnea  Objective: Vitals:   02/04/19 0014 02/04/19 0400 02/04/19 0500 02/04/19 0800  BP: (!) 173/78 (!) 152/62  (!)  153/75  Pulse: 83 83 81 71  Resp: 20  (!) 21 19  Temp: 99.2 F (37.3 C) 98.8 F (37.1 C)  98.2 F (36.8 C)  TempSrc: Axillary Oral  Oral  SpO2: 91% 94% (!) 84% 92%  Weight:      Height:        Intake/Output Summary (Last 24 hours) at 02/04/2019 1205 Last data filed at 02/04/2019 1022 Gross per 24 hour  Intake 860 ml  Output 2200 ml  Net -1340 ml   Filed Weights   01/30/19 1248 01/31/19 0650 02/03/19 0500  Weight: 71 kg 79.3 kg 79.3 kg   Examination: Physical Exam:  Gen: Awake, Alert, Oriented X 3, ill-appearing, no distress HEENT: PERRLA, Neck supple, no JVD Lungs: Clear CVS: RRR,No Gallops,Rubs or new Murmurs Abd: soft, Non tender, non distended, BS present Extremities: No edema Skin: no new rashes    Data Reviewed: I have personally reviewed following labs and imaging studies  CBC: Recent Labs  Lab 01/29/19 0259 01/29/19 0259 01/30/19 0707 01/30/19 0707 01/31/19 0653 02/01/19 0510 02/02/19 0514 02/03/19 0757 02/04/19 0324  WBC 15.7*   < > 16.8*   < > 13.5* 11.6* 9.4 10.3 11.0*  NEUTROABS 13.0*  --  13.8*  --  10.1* 9.1*  --   --   --   HGB 6.7*   < > 8.7*   < > 8.2* 8.0* 7.8* 8.6* 8.6*  HCT 20.5*   < > 26.5*   < > 25.2* 26.3* 26.0* 28.5* 28.0*  MCV 84.7   < > 83.6   < > 85.7 89.8 91.5 90.5 90.0  PLT 335   < > 236   < > 323 337 259 277 297   < > = values in this interval not displayed.   Basic Metabolic Panel: Recent Labs  Lab 01/29/19 0259 01/29/19 0259 01/30/19 0707 01/30/19 0707 01/31/19 0653 02/01/19 0510 02/02/19 0514 02/03/19 0307 02/04/19 0324  NA 136   < > 137   < > 141 139 146* 141 140  K 3.7   < > 3.7   < > 3.4* 3.7 4.2 3.3* 3.5  CL 99   < > 99   < > 105 103 108 108 105  CO2 24   < > 23   < > 27 23 24  21* 23  GLUCOSE 154*   < > 163*   < > 99 127* 95 109* 121*  BUN 80*   < > 92*   < > 47* 67* 76* 77* 68*  CREATININE 7.36*   < > 8.22*   < > 5.02* 5.89* 6.11* 5.79* 5.71*  CALCIUM 7.8*   < > 8.1*   < > 7.7* 7.9* 8.1* 8.4* 8.3*  MG 1.9   --  2.0  --  1.7 1.7  --   --   --   PHOS 8.6*   < > 8.7*   < > 6.1* 5.7* 6.4* 5.2* 6.2*   < > = values in this interval not displayed.   GFR: Estimated Creatinine Clearance: 10 mL/min (A) (by C-G formula based on SCr of 5.71 mg/dL (H)). Liver Function Tests:  Recent Labs  Lab 01/29/19 0259 01/29/19 0259 01/30/19 0707 01/30/19 0707 01/31/19 0653 02/01/19 0510 02/02/19 0514 02/03/19 0307 02/04/19 0324  AST 19  --  19  --  17 14*  --   --   --   ALT 27  --  28  --  27 26  --   --   --   ALKPHOS 34*  --  37*  --  33* 32*  --   --   --   BILITOT 0.5  --  0.4  --  0.3 0.3  --   --   --   PROT 4.6*  --  5.3*  --  4.7* 4.8*  --   --   --   ALBUMIN 1.6*   < > 1.6*   < > 1.5* 1.6* 1.6* 2.0* 2.0*   < > = values in this interval not displayed.   No results for input(s): LIPASE, AMYLASE in the last 168 hours. No results for input(s): AMMONIA in the last 168 hours. Coagulation Profile: No results for input(s): INR, PROTIME in the last 168 hours. Cardiac Enzymes: No results for input(s): CKTOTAL, CKMB, CKMBINDEX, TROPONINI in the last 168 hours. BNP (last 3 results) No results for input(s): PROBNP in the last 8760 hours. HbA1C: No results for input(s): HGBA1C in the last 72 hours. CBG: Recent Labs  Lab 02/03/19 1130 02/03/19 1647 02/03/19 2119 02/04/19 0805 02/04/19 1153  GLUCAP 119* 110* 108* 93 87   Lipid Profile: No results for input(s): CHOL, HDL, LDLCALC, TRIG, CHOLHDL, LDLDIRECT in the last 72 hours. Thyroid Function Tests: No results for input(s): TSH, T4TOTAL, FREET4, T3FREE, THYROIDAB in the last 72 hours. Anemia Panel: Recent Labs    02/03/19 0307 02/04/19 0324  FERRITIN 179 154   Sepsis Labs: Recent Labs  Lab 01/29/19 0259 01/30/19 0707  PROCALCITON 0.82 0.57    No results found for this or any previous visit (from the past 240 hour(s)).  Radiology Studies: No results found.  Scheduled Meds: . amLODipine  10 mg Oral Daily  . Chlorhexidine Gluconate  Cloth  6 each Topical Q0600  . darbepoetin (ARANESP) injection - NON-DIALYSIS  100 mcg Subcutaneous Q Wed-1800  . heparin  7,500 Units Subcutaneous Q8H  . influenza vac split quadrivalent PF  0.5 mL Intramuscular Tomorrow-1000  . insulin aspart  0-9 Units Subcutaneous TID WC  . pantoprazole  40 mg Oral Q1200   Continuous Infusions: . sodium chloride    . sodium chloride       LOS: 14 days   Domenic Polite, MD [facility]

## 2019-02-05 LAB — GLUCOSE, CAPILLARY
Glucose-Capillary: 84 mg/dL (ref 70–99)
Glucose-Capillary: 93 mg/dL (ref 70–99)

## 2019-02-05 LAB — RENAL FUNCTION PANEL
Albumin: 1.7 g/dL — ABNORMAL LOW (ref 3.5–5.0)
Anion gap: 11 (ref 5–15)
BUN: 61 mg/dL — ABNORMAL HIGH (ref 8–23)
CO2: 24 mmol/L (ref 22–32)
Calcium: 8.3 mg/dL — ABNORMAL LOW (ref 8.9–10.3)
Chloride: 107 mmol/L (ref 98–111)
Creatinine, Ser: 5.11 mg/dL — ABNORMAL HIGH (ref 0.44–1.00)
GFR calc Af Amer: 10 mL/min — ABNORMAL LOW (ref >60)
GFR calc non Af Amer: 8 mL/min — ABNORMAL LOW (ref >60)
Glucose, Bld: 92 mg/dL (ref 70–99)
Phosphorus: 6.1 mg/dL — ABNORMAL HIGH (ref 2.5–4.6)
Potassium: 3 mmol/L — ABNORMAL LOW (ref 3.5–5.1)
Sodium: 142 mmol/L (ref 135–145)

## 2019-02-05 LAB — C-REACTIVE PROTEIN: CRP: 5.2 mg/dL — ABNORMAL HIGH (ref <1.0)

## 2019-02-05 LAB — FERRITIN: Ferritin: 145 ng/mL (ref 11–307)

## 2019-02-05 LAB — D-DIMER, QUANTITATIVE: D-Dimer, Quant: 1.64 ug/mL-FEU — ABNORMAL HIGH (ref 0.00–0.50)

## 2019-02-05 MED ORDER — POTASSIUM CHLORIDE CRYS ER 20 MEQ PO TBCR
40.0000 meq | EXTENDED_RELEASE_TABLET | Freq: Once | ORAL | Status: AC
  Administered 2019-02-05: 40 meq via ORAL
  Filled 2019-02-05: qty 2

## 2019-02-05 MED ORDER — ACETAMINOPHEN 325 MG PO TABS: 650.0000 mg | ORAL_TABLET | Freq: Four times a day (QID) | ORAL | Status: AC | PRN

## 2019-02-05 MED ORDER — AMLODIPINE BESYLATE 10 MG PO TABS: 10.0000 mg | ORAL_TABLET | Freq: Every day | ORAL | 0 refills | Status: AC

## 2019-02-05 MED ORDER — PANTOPRAZOLE SODIUM 40 MG PO TBEC: 40.0000 mg | DELAYED_RELEASE_TABLET | Freq: Every day | ORAL | 0 refills | Status: AC

## 2019-02-05 NOTE — Discharge Summary (Signed)
Physician Discharge Summary  Tracy Escobar [id number] DOB: November 09, 1955 DOA: 01/20/2019  PCP: Patient, No Pcp Per  Admit date: 01/20/2019 Discharge date: 02/05/2019  Time spent: 35 minutes  Recommendations for Outpatient Follow-up:  1. [facility] on 1/27 in [place], check bmet at follow-up 2. Nephrology in [place] area, as soon as possible with labs, discussed with the daughter regarding importance of this for CKD   Discharge Diagnoses:  Hypertension Chronic kidney disease 4-5 AKI on CKD, unknown baseline Anemia of chronic disease COVID-19 pneumonia Diabetes mellitus  Discharge Condition: Stable  Diet recommendation: Renal, diabetic  Filed Weights   01/31/19 0650 02/03/19 0500 02/05/19 0511  Weight: 79.3 kg 79.3 kg 76.4 kg    History of present illness:  Tracy Escobar is a 64/Fwith no significant past medical history was brought to the emergency room on 1/9 with encephalopathy, decreased p.o. intake for a few days. -The emergency room she was noted to have COVID-19 infection with pneumonia and severe acute kidney injury with a creatinine of 8/BUN of [facility] Course:  AKI/ATN close to ESRD Hyperphosphatemia -Admitted with creatinine of 8.6 and BUN of 148, felt to be secondary to Covid,and prerenal component as well as NSAID use(naproxen on med list), in addition we suspect she has had some degree of kidney disease at baseline which was unknown -baseline unknown, patient had not been to a doctor in over 10 to 11 years, last creatinine from 2009 was 1.2 -CT did not show any hydronephrosis -Nephrology consulted, underwent temporary HD catheter placement on 1/9 and dialysis on 1/10, 1/13, 1/15 and 1/18 -Mild improvement in kidney function over the last few days but continues to have good brisk urine output without other symptoms of uremia -HD catheter removed 1/22 by nephrology -Will need close follow-up with PCP and nephrologist in [place]  area, patient is moving there to be closer to family -Renal diet, educated to avoid NSAIDs, nephrotoxins  COVID-19 pneumonia -Imaging on admission consistent with COVID-19 pneumonia -S/p 5 Days of Remdesivir; and 10 days of Decadron -Was also gettingCAPcoverage in ICU, this was completed -Resolved  Acute metabolic Encephalopathy -Secondary to uremia,COVID-19 pneumonia -Resolved  Normocytic Anemia -Hemoglobin has gradually trended down from 9-10 range to 6.7 few days ago  -Transfused 1 unit of PRBC this admission  -Anemia panel suggestive of chronic disease,, she was given EPO this admission -Hemoglobin in the 8.6-9 range now -Follow-up with nephrology in the [place] area and consider EPO injections  Hyperkalemia   -Resolved   Hyperglycemia New diagnosis Diabetes Mellitus Type 2 - Hemoglobin A1c was 6.7 -CBG stable, recommend weight loss, carb modified diet  Obesity  -Estimated body mass index is 31.98 kg/m as calculated from the following:   Height as of this encounter: 5\' 2"  (1.575 m).   Weight as of this encounter: 79.3 kg. -Weight Loss and Dietary Counseling   HTN -Started on amlodipine 10 mg daily  Discharge Exam: Vitals:   02/05/19 0800 02/05/19 0933  BP: (!) 148/68 (!) 148/68  Pulse: 69   Resp: 19   Temp:    SpO2: 94%     General: AAOx3 Cardiovascular: S1-S2, regular rate rhythm Respiratory: Clear  Discharge Instructions   Discharge Instructions    Discharge instructions   Complete by: As directed    Renal Diabetic Diet   Increase activity slowly   Complete by: As directed      Allergies as of 02/05/2019   No Known Allergies     Medication List  STOP taking these medications   HYDROcodone-acetaminophen 5-325 MG tablet Commonly known as: NORCO/VICODIN   naproxen 500 MG tablet Commonly known as: NAPROSYN   predniSONE 20 MG tablet Commonly known as: DELTASONE   tiZANidine 4 MG tablet Commonly known as: Zanaflex     TAKE these  medications   acetaminophen 325 MG tablet Commonly known as: Tylenol Take 2 tablets (650 mg total) by mouth every 6 (six) hours as needed for mild pain or headache.   amLODipine 10 MG tablet Commonly known as: NORVASC Take 1 tablet (10 mg total) by mouth daily. Start taking on: February 06, 2019   pantoprazole 40 MG tablet Commonly known as: PROTONIX Take 1 tablet (40 mg total) by mouth daily at 12 noon. Start taking on: February 06, 2019            Durable Medical Equipment  (From admission, onward)         Start     Ordered   02/03/19 1651  For home use only DME Walker rolling  Once    Question Answer Comment  Walker: With [place] Wheels   Patient needs a walker to treat with the following condition Mobility impaired      02/03/19 1651   02/03/19 1651  For home use only DME 3 n 1  Once     02/03/19 1651         No Known Allergies Follow-up Information    [facility] Follow up.   Why: Appt 1/27 arrive at 3:00pm.  If you have COVD symptoms prior to appt please call clinic.  Please take with you/daughter; last 30 days of income, photo ID, Proof of Address.  You can apply for the sliding scale fee on 1st appt.   Contact information: ?[address] [place] - [address] [phone]            The results of significant diagnostics from this hospitalization (including imaging, microbiology, ancillary and laboratory) are listed below for reference.    Significant Diagnostic Studies: CT ABDOMEN PELVIS WO CONTRAST  Result Date: 01/21/2019 CLINICAL DATA:  64 year old female with acute renal insufficiency. EXAM: CT ABDOMEN AND PELVIS WITHOUT CONTRAST TECHNIQUE: Multidetector CT imaging of the abdomen and pelvis was performed following the standard protocol without IV contrast. COMPARISON:  None. FINDINGS: Evaluation of this exam is limited in the absence of intravenous contrast. Evaluation is also limited due to respiratory motion artifact as  well as streak artifact caused by patient's arms. Lower chest: Bilateral patchy airspace opacities most consistent with multifocal pneumonia, possibly viral or atypical in etiology. Clinical correlation is recommended. No intra-abdominal free air or free fluid. Hepatobiliary: The liver is grossly unremarkable. No intrahepatic biliary ductal dilatation. No calcified gallstone. Pancreas: The pancreas is somewhat atrophic. No active inflammatory changes. Spleen: Normal in size without focal abnormality. Adrenals/Urinary Tract: The adrenal glands are unremarkable. There is a 1 cm nonobstructing stone in the inferior pole of the left kidney. No hydronephrosis. There is no hydronephrosis or nephrolithiasis on the right. The urinary bladder is decompressed around a Foley catheter. Stomach/Bowel: There is loose stool throughout the colon compatible with diarrheal state. Correlation with clinical exam and stool cultures recommended. There is no bowel obstruction or active inflammation. No evidence of acute appendicitis. Vascular/Lymphatic: Minimal aortoiliac atherosclerotic disease. The IVC is unremarkable. No portal venous gas. There is no adenopathy. Reproductive: The uterus is grossly unremarkable. Other: None Musculoskeletal: Mild degenerative changes of the spine and lower lumbar  facet arthropathy. No acute osseous pathology. IMPRESSION: 1. Diarrheal state. Correlation with clinical exam and stool cultures recommended. No bowel obstruction. 2. A 1 cm nonobstructing left renal inferior pole stone. No hydronephrosis. 3. Multifocal pneumonia, likely viral or atypical in etiology. Clinical correlation is recommended. 4. Aortic Atherosclerosis (ICD10-I70.0). Electronically Signed   By: Anner Crete M.D.   On: 01/21/2019 02:32   CT Head Wo Contrast  Result Date: 01/20/2019 CLINICAL DATA:  64 year old female with altered mental status. EXAM: CT HEAD WITHOUT CONTRAST TECHNIQUE: Contiguous axial images were obtained from  the base of the skull through the vertex without intravenous contrast. COMPARISON:  None. FINDINGS: Brain: The ventricles and sulci appropriate size for patient's age. There is mild periventricular and deep white matter chronic microvascular ischemic changes. There is no acute intracranial hemorrhage. No mass effect or midline shift. No extra-axial fluid collection. Vascular: No hyperdense vessel or unexpected calcification. Skull: Normal. Negative for fracture or focal lesion. Sinuses/Orbits: The visualized paranasal sinuses and the right mastoid air cells are clear. There is partial opacification of the left mastoid air cells. No air-fluid level Other: None IMPRESSION: 1. No acute intracranial pathology. 2. Mild age-related atrophy and chronic microvascular ischemic changes. Electronically Signed   By: Anner Crete M.D.   On: 01/20/2019 23:21   DG CHEST PORT 1 VIEW  Result Date: 02/01/2019 CLINICAL DATA:  64 year old female with history of shortness of breath. COVID-19 positive. EXAM: PORTABLE CHEST 1 VIEW COMPARISON:  Chest x-ray 01/29/2019. FINDINGS: There is a right-sided internal jugular central venous catheter with tip terminating in the mid superior vena cava. Patchy multifocal ill-defined opacities and areas of interstitial prominence again noted throughout the lungs bilaterally. No pleural effusions. No evidence of pulmonary edema. Heart size is upper limits of normal. Upper mediastinal contours are within normal limits. IMPRESSION: 1. Support apparatus, as above. 2. Multilobar bilateral pneumonia with similar aeration compared to the prior examination, as above. Electronically Signed   By: Vinnie Langton M.D.   On: 02/01/2019 08:27   DG CHEST PORT 1 VIEW  Result Date: 01/29/2019 CLINICAL DATA:  COVID-19. EXAM: PORTABLE CHEST 1 VIEW COMPARISON:  January 27, 2019. FINDINGS: The heart size and mediastinal contours are within normal limits. No pneumothorax or pleural effusion is noted. Stable  mild bilateral multiple lung opacities are noted concerning for multifocal pneumonia. The visualized skeletal structures are unremarkable. IMPRESSION: Stable bilateral lung opacities are noted concerning for multifocal pneumonia. Electronically Signed   By: Marijo Conception M.D.   On: 01/29/2019 10:00   DG CHEST PORT 1 VIEW  Result Date: 01/27/2019 CLINICAL DATA:  Short of breath EXAM: PORTABLE CHEST 1 VIEW COMPARISON:  01/21/2019 FINDINGS: Mild airspace disease bilaterally shows interval improvement. No new area of consolidation or effusion. Central line tip in the lower SVC unchanged. IMPRESSION: Mild bilateral airspace disease with interval improvement. Electronically Signed   By: Franchot Gallo M.D.   On: 01/27/2019 07:59   DG Chest Portable 1 View  Result Date: 01/21/2019 CLINICAL DATA:  Vascular catheter placement. EXAM: PORTABLE CHEST 1 VIEW COMPARISON:  Chest radiograph dated 01/21/2019. FINDINGS: There has been interval placement of a right internal jugular vascular sheath with tip overlying the superior vena cava. The heart is normal in size. There is no pneumothorax. There is no pleural effusion. Patchy bilateral airspace opacities are not significantly changed. IMPRESSION: 1. Right internal jugular vascular sheath with tip overlying the superior vena cava. Electronically Signed   By: Zerita Boers M.D.   On:  01/21/2019 18:54   DG Chest Port 1 View  Result Date: 01/21/2019 CLINICAL DATA:  64 year old female with shortness of breath. EXAM: PORTABLE CHEST 1 VIEW COMPARISON:  None. FINDINGS: Bilateral patchy and streaky densities most consistent with multifocal pneumonia, likely viral or atypical in etiology. Clinical correlation is recommended. There is no pleural effusion or pneumothorax. The cardiac silhouette is within normal limits. No acute osseous pathology. IMPRESSION: Multifocal pneumonia, likely viral or atypical in etiology. Electronically Signed   By: Anner Crete M.D.   On:  01/21/2019 01:01    Microbiology: No results found for this or any previous visit (from the past 240 hour(s)).   Labs: Basic Metabolic Panel: Recent Labs  Lab 01/30/19 0707 01/30/19 0707 01/31/19 [id number] 01/31/19 [id number] 02/01/19 0510 02/02/19 0514 02/03/19 0307 02/04/19 0324 02/05/19 0301  NA 137   < > 141   < > 139 146* 141 140 142  K 3.7   < > 3.4*   < > 3.7 4.2 3.3* 3.5 3.0*  CL 99   < > 105   < > 103 108 108 105 107  CO2 23   < > 27   < > 23 24 21* 23 24  GLUCOSE 163*   < > 99   < > 127* 95 109* 121* 92  BUN 92*   < > 47*   < > 67* 76* 77* 68* 61*  CREATININE 8.22*   < > 5.02*   < > 5.89* 6.11* 5.79* 5.71* 5.11*  CALCIUM 8.1*   < > 7.7*   < > 7.9* 8.1* 8.4* 8.3* 8.3*  MG 2.0  --  1.7  --  1.7  --   --   --   --   PHOS 8.7*   < > 6.1*   < > 5.7* 6.4* 5.2* 6.2* 6.1*   < > = values in this interval not displayed.   Liver Function Tests: Recent Labs  Lab 01/30/19 0707 01/30/19 0707 01/31/19 0653 01/31/19 0653 02/01/19 0510 02/02/19 0514 02/03/19 0307 02/04/19 0324 02/05/19 0301  AST 19  --  17  --  14*  --   --   --   --   ALT 28  --  27  --  26  --   --   --   --   ALKPHOS 37*  --  33*  --  32*  --   --   --   --   BILITOT 0.4  --  0.3  --  0.3  --   --   --   --   PROT 5.3*  --  4.7*  --  4.8*  --   --   --   --   ALBUMIN 1.6*   < > 1.5*   < > 1.6* 1.6* 2.0* 2.0* 1.7*   < > = values in this interval not displayed.   No results for input(s): LIPASE, AMYLASE in the last 168 hours. No results for input(s): AMMONIA in the last 168 hours. CBC: Recent Labs  Lab 01/30/19 0707 01/30/19 0707 01/31/19 0653 02/01/19 0510 02/02/19 0514 02/03/19 0757 02/04/19 0324  WBC 16.8*   < > 13.5* 11.6* 9.4 10.3 11.0*  NEUTROABS 13.8*  --  10.1* 9.1*  --   --   --   HGB 8.7*   < > 8.2* 8.0* 7.8* 8.6* 8.6*  HCT 26.5*   < > 25.2* 26.3* 26.0* 28.5* 28.0*  MCV 83.6   < > 85.7 89.8  91.5 90.5 90.0  PLT 236   < > 323 337 259 277 297   < > = values in this interval not displayed.    Cardiac Enzymes: No results for input(s): CKTOTAL, CKMB, CKMBINDEX, TROPONINI in the last 168 hours. BNP: BNP (last 3 results) No results for input(s): BNP in the last 8760 hours.  ProBNP (last 3 results) No results for input(s): PROBNP in the last 8760 hours.  CBG: Recent Labs  Lab 02/04/19 0805 02/04/19 1153 02/04/19 1705 02/04/19 2202 02/05/19 0743  GLUCAP 93 87 108* 163* 84       Signed:  Domenic Polite MD.  [facility] 02/05/2019, 12:09 PM

## 2019-02-05 NOTE — Care Management (Signed)
Faxed [place] orders to [phone] Carlyon Prows per instructions in previous CM note. No other CM needs identified at this time.

## 2019-02-15 ENCOUNTER — Ambulatory Visit: Payer: Self-pay | Admitting: Nurse Practitioner

## 2021-04-27 IMAGING — CT CT ABD-PELV W/O CM
2 of 4 series · 16 of 46 positions shown, 18 images · non-contrast
Comparison: None.

CLINICAL DATA: 63-year-old female with acute renal insufficiency.

EXAM:
CT ABDOMEN AND PELVIS WITHOUT CONTRAST
TECHNIQUE: Multidetector CT imaging of the abdomen and pelvis was performed
following the standard protocol without IV contrast.

[Series 3: a/p w/o 5mm · axial · non-contrast · 0.94mm/px · z∈[+860,+1275]mm · 13 of 91 slices shown, 15 images]
[im 4/91  soft-tissue]
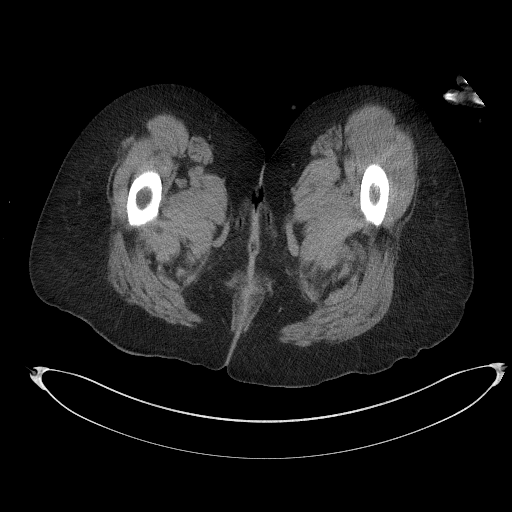
[im 4/91  bone]
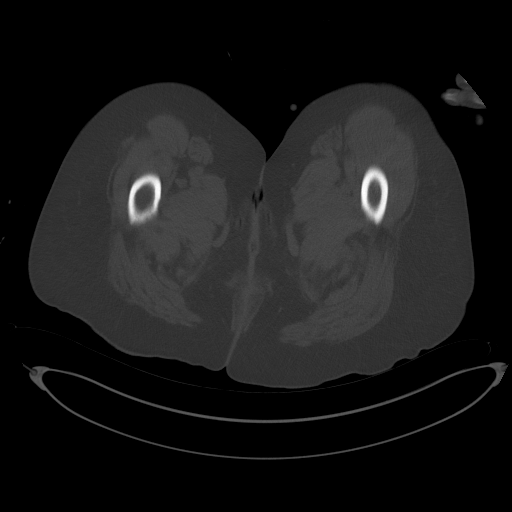
[im 12/91  soft-tissue]
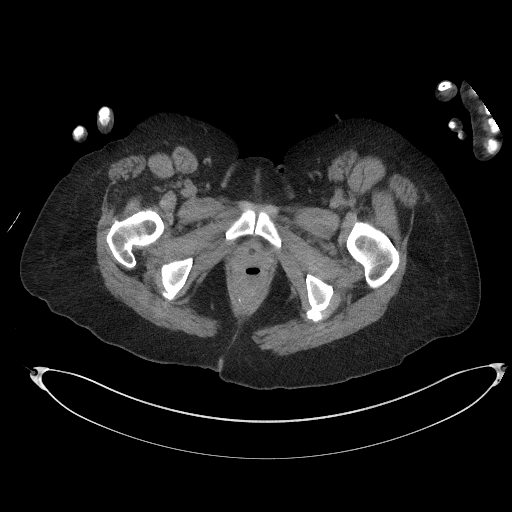
[im 19/91  soft-tissue]
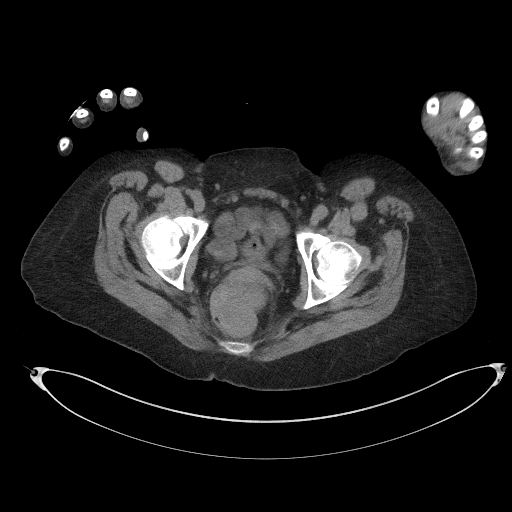
[im 27/91  soft-tissue]
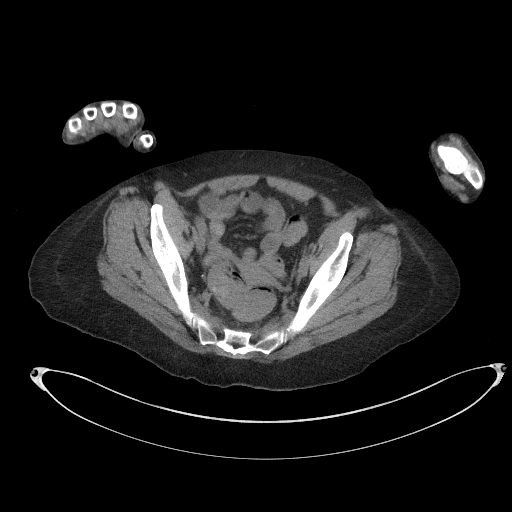
[im 31/91  soft-tissue]
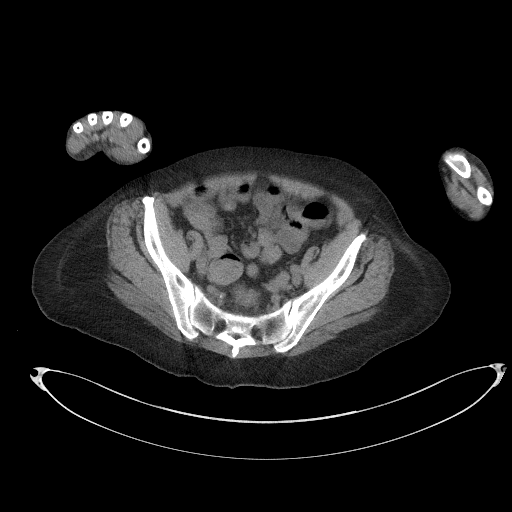
[im 38/91  soft-tissue]
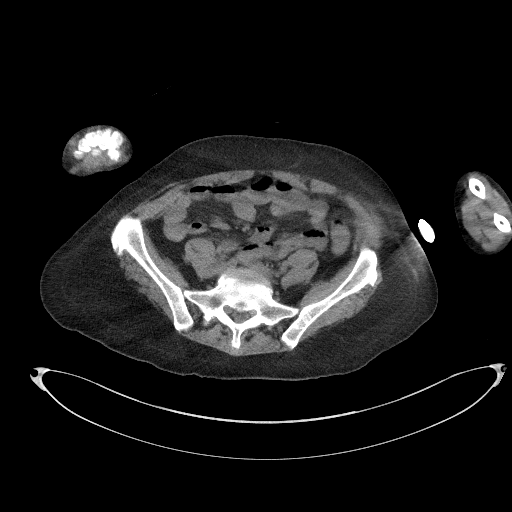
[im 46/91  soft-tissue]
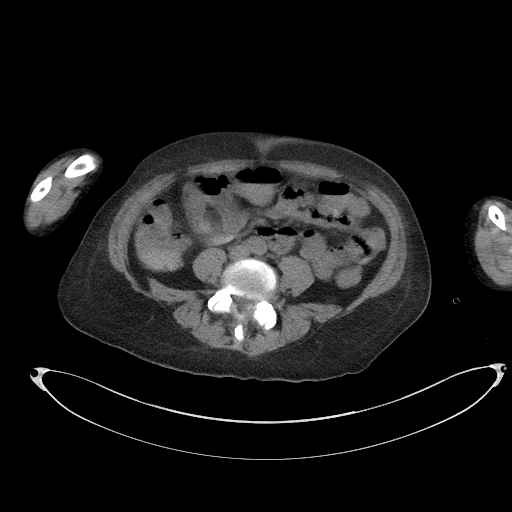
[im 53/91  soft-tissue]
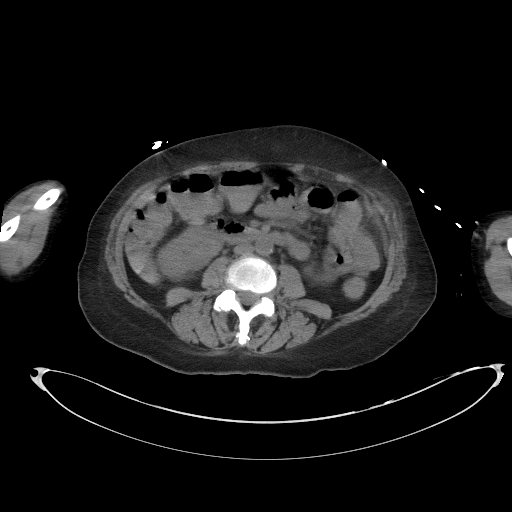
[im 61/91  soft-tissue]
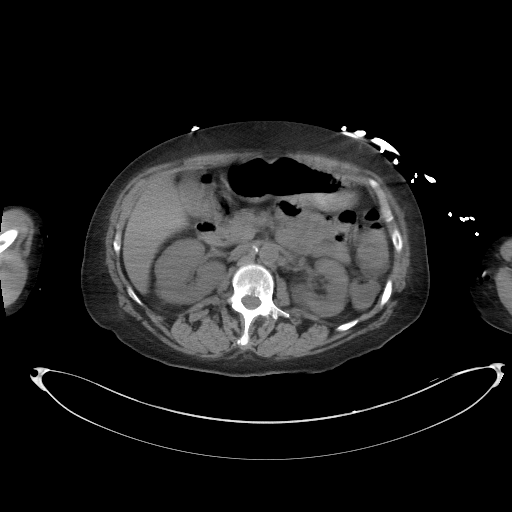
[im 61/91  bone]
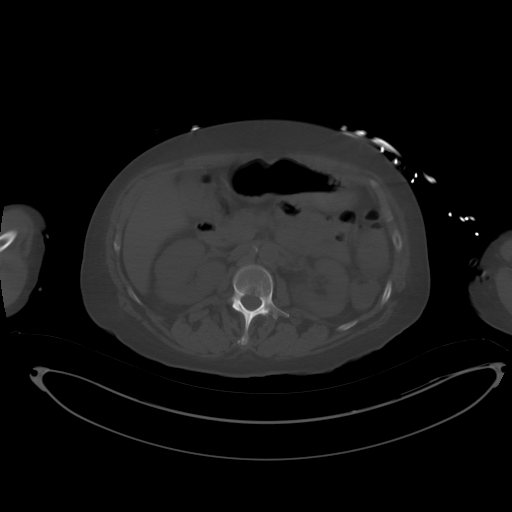
[im 64/91  soft-tissue]
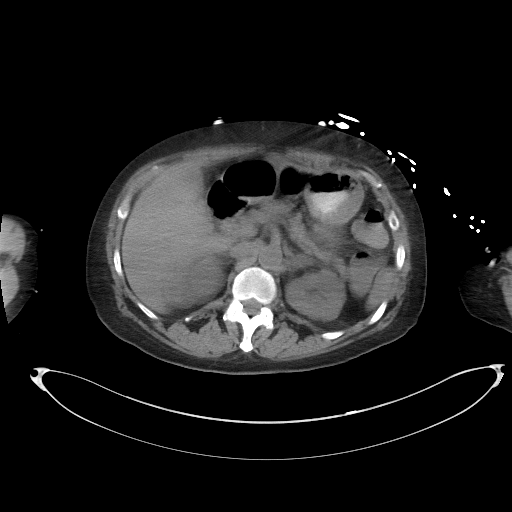
[im 72/91  soft-tissue]
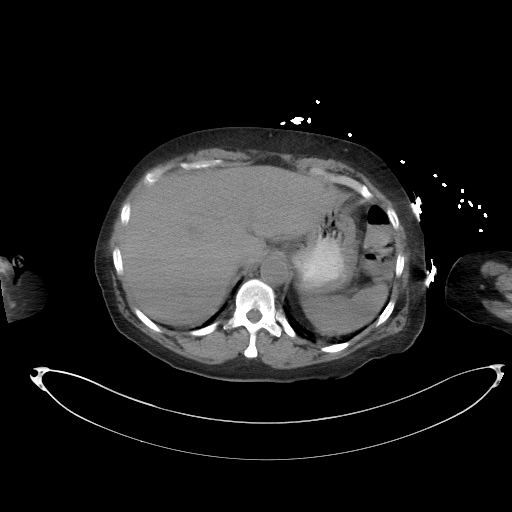
[im 79/91  soft-tissue]
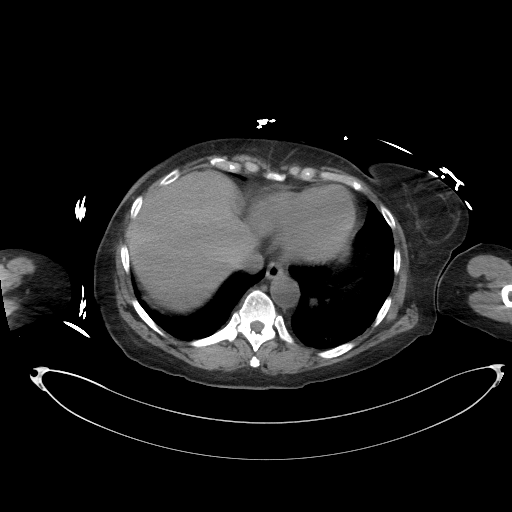
[im 87/91  soft-tissue]
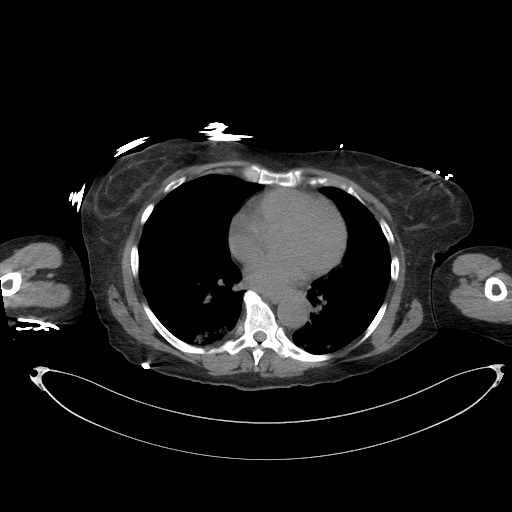

[Series 6: a/p w/o cor · coronal · non-contrast · 0.78mm/px · 3 of 151 slices shown]
[im 51/151  soft-tissue]
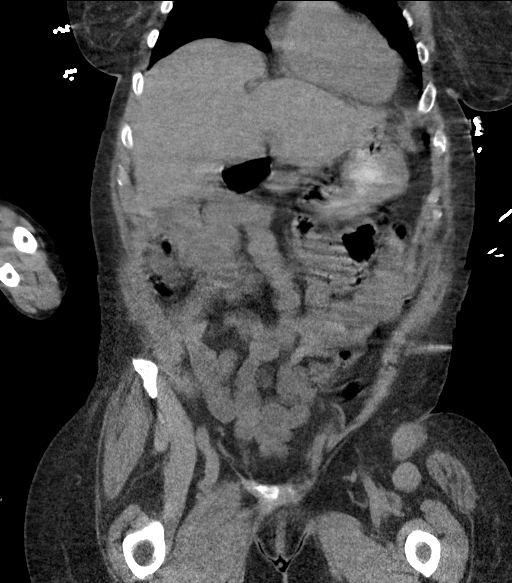
[im 67/151  soft-tissue]
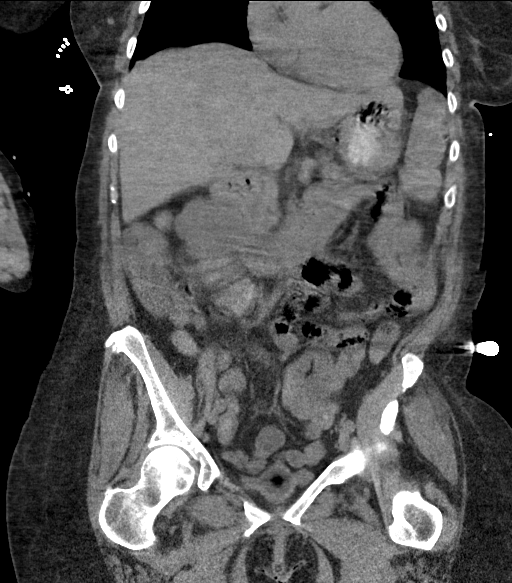
[im 84/151  soft-tissue]
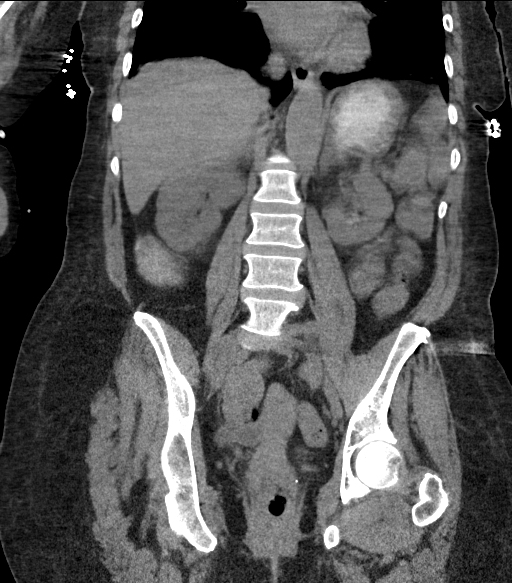

[16 of 46 positions shown; findings below may reference images not displayed]

FINDINGS: Evaluation of this exam is limited in the absence of intravenous
contrast. Evaluation is also limited due to respiratory motion
artifact as well as streak artifact caused by patient's arms.

Lower chest: Bilateral patchy airspace opacities most consistent
with multifocal pneumonia, possibly viral or atypical in etiology.
Clinical correlation is recommended.

No intra-abdominal free air or free fluid.

Hepatobiliary: The liver is grossly unremarkable. No intrahepatic
biliary ductal dilatation. No calcified gallstone.

Pancreas: The pancreas is somewhat atrophic. No active inflammatory
changes.

Spleen: Normal in size without focal abnormality.

Adrenals/Urinary Tract: The adrenal glands are unremarkable. There
is a 1 cm nonobstructing stone in the inferior pole of the left
kidney. No hydronephrosis. There is no hydronephrosis or
nephrolithiasis on the right. The urinary bladder is decompressed
around a Foley catheter.

Stomach/Bowel: There is loose stool throughout the colon compatible
with diarrheal state. Correlation with clinical exam and stool
cultures recommended. There is no bowel obstruction or active
inflammation. No evidence of acute appendicitis.

Vascular/Lymphatic: Minimal aortoiliac atherosclerotic disease. The
IVC is unremarkable. No portal venous gas. There is no adenopathy.

Reproductive: The uterus is grossly unremarkable.

Other: None

Musculoskeletal: Mild degenerative changes of the spine and lower
lumbar facet arthropathy. No acute osseous pathology.
IMPRESSION: 1. Diarrheal state. Correlation with clinical exam and stool
cultures recommended. No bowel obstruction.
2. A 1 cm nonobstructing left renal inferior pole stone. No
hydronephrosis.
3. Multifocal pneumonia, likely viral or atypical in etiology.
Clinical correlation is recommended.
4. Aortic Atherosclerosis ([id number]).

## 2021-05-08 IMAGING — DX DG CHEST 1V PORT
1 series · 1 of 1 positions shown · non-contrast
Comparison: Chest x-ray 01/29/2019.

CLINICAL DATA: 63-year-old female with history of shortness of
breath. XTEIK-0C positive.

EXAM:
PORTABLE CHEST 1 VIEW

[chest ap]
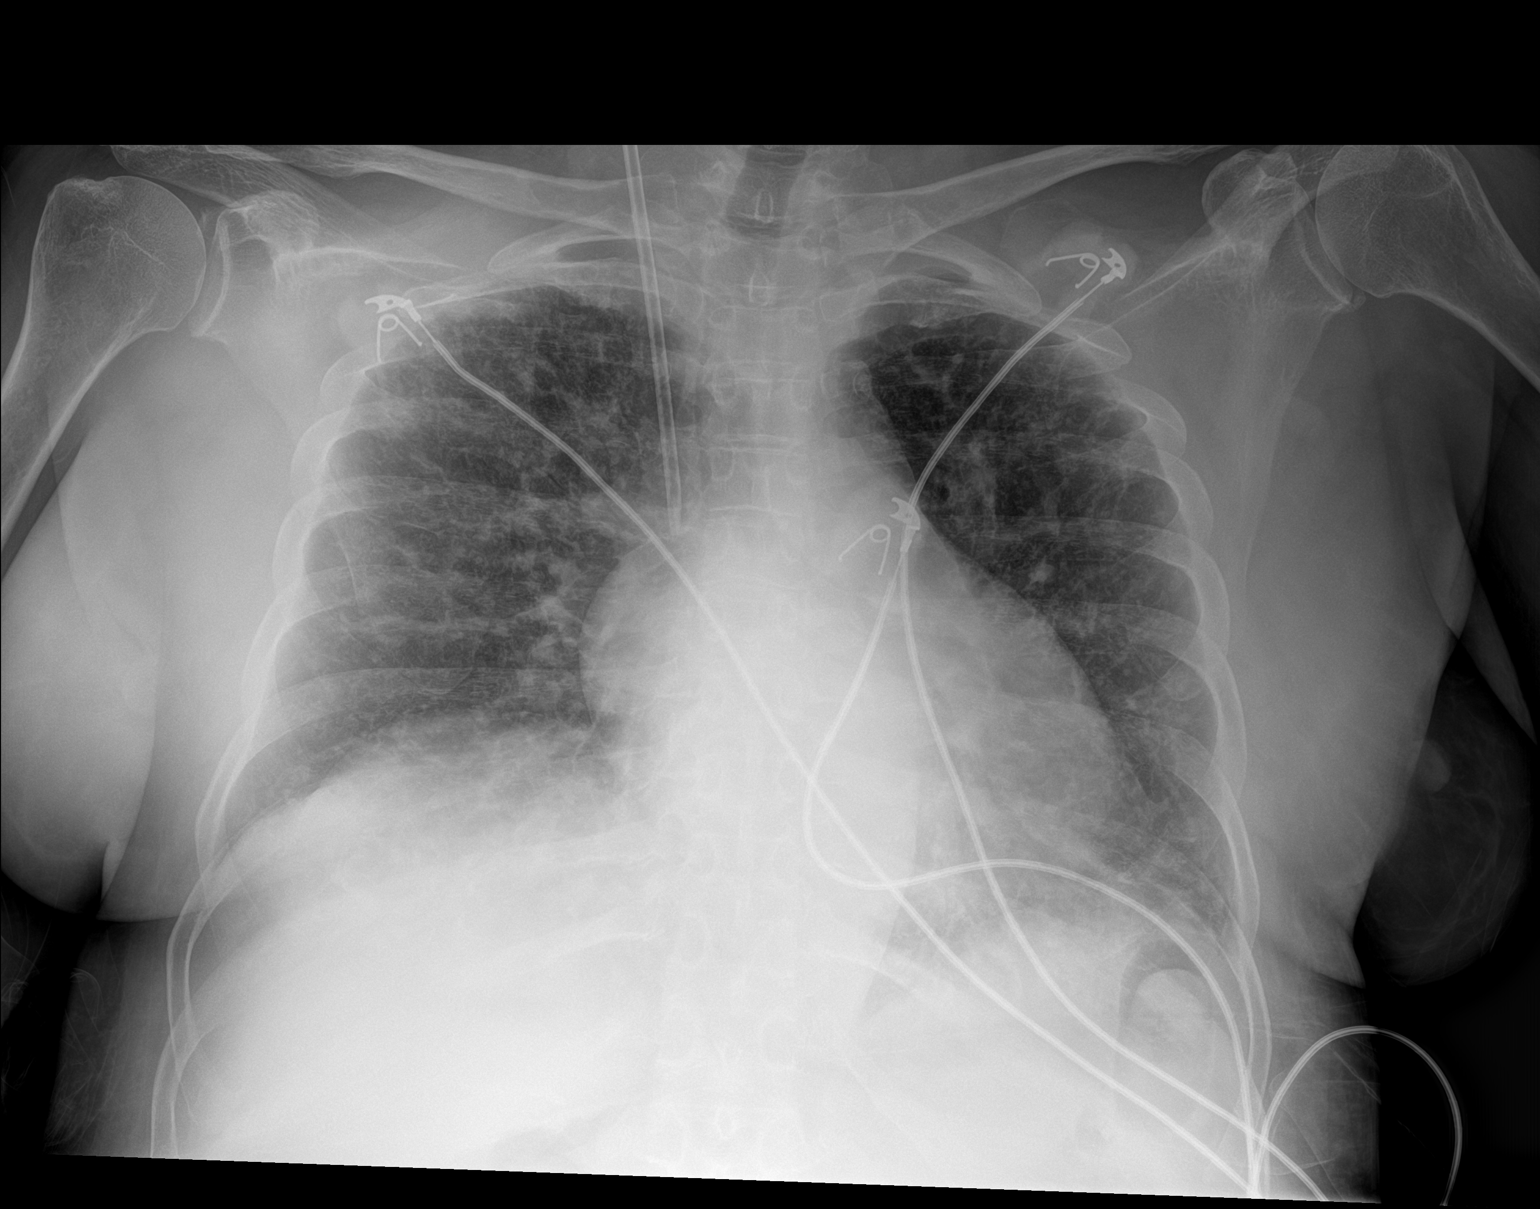

[1 of 1 positions shown; findings below may reference images not displayed]

FINDINGS: There is a right-sided internal jugular central venous catheter with
tip terminating in the mid superior vena cava. Patchy multifocal
ill-defined opacities and areas of interstitial prominence again
noted throughout the lungs bilaterally. No pleural effusions. No
evidence of pulmonary edema. Heart size is upper limits of normal.
Upper mediastinal contours are within normal limits.
IMPRESSION: 1. Support apparatus, as above.
2. Multilobar bilateral pneumonia with similar aeration compared to
the prior examination, as above.
